# Patient Record
Sex: Male | Born: 1965 | Race: Black or African American | Hispanic: No | Marital: Single | Smoking: Never smoker
Health system: Southern US, Community
[De-identification: ages and names within clinical notes are randomized; demographics above are authoritative.]

## PROBLEM LIST (undated history)

## (undated) ENCOUNTER — Emergency Department (HOSPITAL_COMMUNITY): Admission: EM | Payer: BLUE CROSS/BLUE SHIELD | Source: Home / Self Care

## (undated) DIAGNOSIS — E785 Hyperlipidemia, unspecified: Secondary | ICD-10-CM

## (undated) DIAGNOSIS — D179 Benign lipomatous neoplasm, unspecified: Secondary | ICD-10-CM

## (undated) DIAGNOSIS — M25512 Pain in left shoulder: Secondary | ICD-10-CM

## (undated) DIAGNOSIS — R5383 Other fatigue: Secondary | ICD-10-CM

## (undated) DIAGNOSIS — M109 Gout, unspecified: Secondary | ICD-10-CM

## (undated) DIAGNOSIS — M545 Low back pain, unspecified: Secondary | ICD-10-CM

## (undated) DIAGNOSIS — K469 Unspecified abdominal hernia without obstruction or gangrene: Secondary | ICD-10-CM

## (undated) DIAGNOSIS — I10 Essential (primary) hypertension: Secondary | ICD-10-CM

## (undated) DIAGNOSIS — R350 Frequency of micturition: Secondary | ICD-10-CM

## (undated) HISTORY — DX: Other fatigue: R53.83

## (undated) HISTORY — DX: Pain in left shoulder: M25.512

## (undated) HISTORY — DX: Benign lipomatous neoplasm, unspecified: D17.9

## (undated) HISTORY — DX: Hyperlipidemia, unspecified: E78.5

## (undated) HISTORY — DX: Essential (primary) hypertension: I10

## (undated) HISTORY — DX: Unspecified abdominal hernia without obstruction or gangrene: K46.9

## (undated) HISTORY — PX: REDUCTION OF TORSION OF TESTIS: SUR1096

---

## 1996-12-22 HISTORY — PX: HERNIA REPAIR: SHX51

## 2010-02-24 ENCOUNTER — Emergency Department (HOSPITAL_COMMUNITY): Admission: EM | Admit: 2010-02-24 | Discharge: 2010-02-24 | Payer: Self-pay | Admitting: Emergency Medicine

## 2010-05-22 ENCOUNTER — Encounter: Admission: RE | Admit: 2010-05-22 | Discharge: 2010-05-22 | Payer: Self-pay | Admitting: *Deleted

## 2010-11-04 ENCOUNTER — Encounter: Admission: RE | Admit: 2010-11-04 | Discharge: 2010-11-04 | Payer: Self-pay | Admitting: General Surgery

## 2011-01-11 ENCOUNTER — Encounter: Payer: Self-pay | Admitting: General Surgery

## 2011-10-09 ENCOUNTER — Encounter (INDEPENDENT_AMBULATORY_CARE_PROVIDER_SITE_OTHER): Payer: Self-pay | Admitting: General Surgery

## 2011-10-10 ENCOUNTER — Encounter (INDEPENDENT_AMBULATORY_CARE_PROVIDER_SITE_OTHER): Payer: Self-pay | Admitting: General Surgery

## 2011-10-10 ENCOUNTER — Ambulatory Visit (INDEPENDENT_AMBULATORY_CARE_PROVIDER_SITE_OTHER): Payer: BC Managed Care – PPO | Admitting: General Surgery

## 2011-10-10 VITALS — BP 132/86 | HR 84 | Temp 97.2°F | Resp 20 | Ht 71.0 in | Wt 204.4 lb

## 2011-10-10 DIAGNOSIS — D1720 Benign lipomatous neoplasm of skin and subcutaneous tissue of unspecified limb: Secondary | ICD-10-CM

## 2011-10-10 DIAGNOSIS — D1739 Benign lipomatous neoplasm of skin and subcutaneous tissue of other sites: Secondary | ICD-10-CM

## 2011-10-10 NOTE — Patient Instructions (Signed)
An adult who can drive will need to come with you to drive U. home after surgery and the responsible for use at night he will not be able to drive until the following day. Hopefully you will be out of work only a few days but that will be determined at the time of surgery

## 2011-10-10 NOTE — Progress Notes (Signed)
Subjective:     Patient ID: Kenneth Logan, male   DOB: 08-16-1966, 44 y.o.   MRN: [id number]  HPIList Kenneth Logan is a 45 year old male history of mild hypertension who I saw approximately 10 months ago when he was referred to Dr. Nikki Dom [facility] orthopedics for lipoma in the left supraclavicular area. 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 Patient is continued working of the lipoma does not appear to have increased in size and it still measures probably 3 cm it is located in the supraclavicular really count over the shoulder area it is difficult to measure exactly 3 or the patient's decided he like to have the area removed and I think Workmen's Comp. is no longer involved in the patient's management   Review of Systems Current Outpatient Prescriptions  Medication Sig Dispense Refill  . Ibuprofen (ADVIL PO) Take 3,200 mg by mouth daily.        . simvastatin (ZOCOR) 20 MG tablet       . valsartan-hydrochlorothiazide (DIOVAN-HCT) 160-12.5 MG per tablet Take 1 tablet by mouth daily.         Past Surgical History  Procedure Date  . Hernia repair 1998    left inguinal hernia    Patient states he takes his blood pressure medicines and he thinks he's had a cardiac evaluation but he can't give me the doctor's name and looking at each sharp don't find any thigh and then he thought maybe it was agreed for cardiology but they say he's not a pleasure he's got check for other significant inflammation he's had a CT of the neck there was originally scheduled for CT of the chest that was canceled when I found a lipoma and he also thinks he's had an EKG during that time but he can give me details he works involve use in his upper extremity is the activity is not really hard heavy the a lot of repetitive problems and hopefully removal of lipoma minimize the discomfort that he was having    Objective:   Physical ExamBP 132/86  Pulse 84  Temp 97.2 F (36.2 C)  Resp 20  Ht 5\' 11"  (1.803 m)  Wt 204 lb 6 oz (92.704 kg)  BMI 28.50 kg/m2 Patient's a male appears his stated age and in no acute distress he says he is tired he works third shift last night and he was counseled on first went to the examining room on reexamination he still got a slight prominence and the left supraclavicular area and all measurement best I can tell is probably about 2-1/2 inches in size it may be a little larger and it may actually port the subfascial. Thank you be best to remove this which seizure they can do him on the anterior LMA tube and hopefully not require an endotracheal tube. He says he's had a previous lockout, removed the base of the neck that was done local anesthesia and he may have a little recurrence of not sure who remove that lipoma. His cardiac normal sinus rhythm his chest clear to P&A abdomen no acute tenderness or megaly. No axillary lymphadenopathy or supraclavicular lymphadenopathy that I could appreciate    Assessment:     Lipoma left supraclavicular area that we will plan on removing  [facility]. Patient will try to get copies of his chest and cardiac reports so that it will not need to be repeated  otherwise we will chest pain x-ray and EKG. We'll do a CBC in theB met at the time of his surgery preop    Plan:     To schedulers

## 2011-11-18 ENCOUNTER — Encounter (HOSPITAL_BASED_OUTPATIENT_CLINIC_OR_DEPARTMENT_OTHER): Payer: Self-pay | Admitting: *Deleted

## 2011-11-18 NOTE — Progress Notes (Signed)
NPO AFTER MN. PT TO ARRIVE 0930. NEEDS CBC W/ DIFF, BMET, CXR. PER PT EKG DONE JAN 2012, HE IS TO BRING DOS (IF NOT CURRENT, REPEAT). WILL TAKE ZOCOR AM OF SURG. W/ SIP OF WATER.

## 2011-11-19 ENCOUNTER — Encounter (HOSPITAL_BASED_OUTPATIENT_CLINIC_OR_DEPARTMENT_OTHER): Payer: Self-pay | Admitting: *Deleted

## 2011-11-20 ENCOUNTER — Ambulatory Visit (HOSPITAL_BASED_OUTPATIENT_CLINIC_OR_DEPARTMENT_OTHER): Payer: BC Managed Care – PPO | Admitting: Anesthesiology

## 2011-11-20 ENCOUNTER — Ambulatory Visit (HOSPITAL_BASED_OUTPATIENT_CLINIC_OR_DEPARTMENT_OTHER)
Admission: RE | Admit: 2011-11-20 | Discharge: 2011-11-20 | Disposition: A | Discharge status: Home / Self Care | Payer: BC Managed Care – PPO | Source: Ambulatory Visit | Attending: General Surgery | Admitting: General Surgery

## 2011-11-20 ENCOUNTER — Encounter (HOSPITAL_BASED_OUTPATIENT_CLINIC_OR_DEPARTMENT_OTHER): Payer: Self-pay | Admitting: Anesthesiology

## 2011-11-20 ENCOUNTER — Encounter (HOSPITAL_BASED_OUTPATIENT_CLINIC_OR_DEPARTMENT_OTHER)
Admission: RE | Disposition: A | Discharge status: Home / Self Care | Payer: Self-pay | Source: Ambulatory Visit | Attending: General Surgery

## 2011-11-20 ENCOUNTER — Encounter (HOSPITAL_BASED_OUTPATIENT_CLINIC_OR_DEPARTMENT_OTHER): Payer: Self-pay | Admitting: *Deleted

## 2011-11-20 ENCOUNTER — Ambulatory Visit (HOSPITAL_COMMUNITY): Payer: BC Managed Care – PPO

## 2011-11-20 DIAGNOSIS — D1779 Benign lipomatous neoplasm of other sites: Secondary | ICD-10-CM | POA: Insufficient documentation

## 2011-11-20 DIAGNOSIS — I10 Essential (primary) hypertension: Secondary | ICD-10-CM | POA: Insufficient documentation

## 2011-11-20 DIAGNOSIS — D1739 Benign lipomatous neoplasm of skin and subcutaneous tissue of other sites: Secondary | ICD-10-CM

## 2011-11-20 DIAGNOSIS — Z01818 Encounter for other preprocedural examination: Secondary | ICD-10-CM | POA: Insufficient documentation

## 2011-11-20 DIAGNOSIS — Z79899 Other long term (current) drug therapy: Secondary | ICD-10-CM | POA: Insufficient documentation

## 2011-11-20 DIAGNOSIS — Z01812 Encounter for preprocedural laboratory examination: Secondary | ICD-10-CM | POA: Insufficient documentation

## 2011-11-20 HISTORY — DX: Low back pain, unspecified: M54.50

## 2011-11-20 HISTORY — DX: Frequency of micturition: R35.0

## 2011-11-20 HISTORY — DX: Low back pain: M54.5

## 2011-11-20 HISTORY — PX: LIPOMA EXCISION: SHX5283

## 2011-11-20 LAB — BASIC METABOLIC PANEL
BUN: 16 mg/dL (ref 6–23)
Creatinine, Ser: 1.26 mg/dL (ref 0.50–1.35)
GFR calc Af Amer: 77 mL/min — ABNORMAL LOW (ref >90)
GFR calc non Af Amer: 66 mL/min — ABNORMAL LOW (ref >90)
Glucose, Bld: 97 mg/dL (ref 70–99)

## 2011-11-20 LAB — CBC
HCT: 39.3 % (ref 39.0–52.0)
Hemoglobin: 13.2 g/dL (ref 13.0–17.0)
MCH: 31.4 pg (ref 26.0–34.0)
MCHC: 33.6 g/dL (ref 30.0–36.0)
MCV: 93.3 fL (ref 78.0–100.0)

## 2011-11-20 LAB — DIFFERENTIAL
Basophils Relative: 1 % (ref 0–1)
Eosinophils Absolute: 0.1 10*3/uL (ref 0.0–0.7)
Eosinophils Relative: 2 % (ref 0–5)
Lymphs Abs: 1.8 10*3/uL (ref 0.7–4.0)
Monocytes Absolute: 0.5 10*3/uL (ref 0.1–1.0)
Monocytes Relative: 10 % (ref 3–12)

## 2011-11-20 SURGERY — EXCISION LIPOMA
Anesthesia: General | Site: Shoulder | Laterality: Left

## 2011-11-20 MED ORDER — ONDANSETRON HCL 4 MG/2ML IJ SOLN
INTRAMUSCULAR | Status: DC | PRN
  Administered 2011-11-20: 4 mg via INTRAVENOUS

## 2011-11-20 MED ORDER — FENTANYL CITRATE 0.05 MG/ML IJ SOLN
25.0000 ug | INTRAMUSCULAR | Status: DC | PRN
  Administered 2011-11-20 (×3): 25 ug via INTRAVENOUS

## 2011-11-20 MED ORDER — CEFAZOLIN SODIUM-DEXTROSE 2-3 GM-% IV SOLR
2.0000 g | Freq: Once | INTRAVENOUS | Status: AC
  Administered 2011-11-20: 2 g via INTRAVENOUS

## 2011-11-20 MED ORDER — MIDAZOLAM HCL 5 MG/5ML IJ SOLN
INTRAMUSCULAR | Status: DC | PRN
  Administered 2011-11-20: 2 mg via INTRAVENOUS

## 2011-11-20 MED ORDER — FENTANYL CITRATE 0.05 MG/ML IJ SOLN
INTRAMUSCULAR | Status: DC | PRN
  Administered 2011-11-20 (×5): 50 ug via INTRAVENOUS
  Administered 2011-11-20: 25 ug via INTRAVENOUS
  Administered 2011-11-20 (×2): 50 ug via INTRAVENOUS
  Administered 2011-11-20: 25 ug via INTRAVENOUS

## 2011-11-20 MED ORDER — DEXAMETHASONE SODIUM PHOSPHATE 4 MG/ML IJ SOLN
INTRAMUSCULAR | Status: DC | PRN
  Administered 2011-11-20: 10 mg via INTRAVENOUS

## 2011-11-20 MED ORDER — LACTATED RINGERS IV SOLN: INTRAVENOUS | Status: DC

## 2011-11-20 MED ORDER — LACTATED RINGERS IV SOLN
INTRAVENOUS | Status: DC
  Administered 2011-11-20 (×2): via INTRAVENOUS

## 2011-11-20 MED ORDER — PROMETHAZINE HCL 25 MG/ML IJ SOLN: 6.2500 mg | INTRAMUSCULAR | Status: DC | PRN

## 2011-11-20 MED ORDER — PROPOFOL 10 MG/ML IV EMUL
INTRAVENOUS | Status: DC | PRN
  Administered 2011-11-20: 200 mg via INTRAVENOUS

## 2011-11-20 MED ORDER — LIDOCAINE-EPINEPHRINE (PF) 1 %-1:200000 IJ SOLN
INTRAMUSCULAR | Status: DC | PRN
  Administered 2011-11-20: 14:00:00 via INTRAMUSCULAR

## 2011-11-20 MED ORDER — LIDOCAINE HCL (CARDIAC) 20 MG/ML IV SOLN
INTRAVENOUS | Status: DC | PRN
  Administered 2011-11-20: 100 mg via INTRAVENOUS

## 2011-11-20 SURGICAL SUPPLY — 60 items
BANDAGE ADHESIVE 1X3 (GAUZE/BANDAGES/DRESSINGS) IMPLANT
BANDAGE GAUZE ELAST BULKY 4 IN (GAUZE/BANDAGES/DRESSINGS) IMPLANT
BENZOIN TINCTURE PRP APPL 2/3 (GAUZE/BANDAGES/DRESSINGS) IMPLANT
BLADE HEX COATED 2.75 (ELECTRODE) ×2 IMPLANT
BLADE SURG 15 STRL LF DISP TIS (BLADE) ×1 IMPLANT
BLADE SURG 15 STRL SS (BLADE) ×1
BLADE SURG ROTATE 9660 (MISCELLANEOUS) IMPLANT
CANISTER SUCTION 1200CC (MISCELLANEOUS) IMPLANT
CANISTER SUCTION 2500CC (MISCELLANEOUS) ×2 IMPLANT
CLEANER CAUTERY TIP 5X5 PAD (MISCELLANEOUS) ×1 IMPLANT
CLOTH BEACON ORANGE TIMEOUT ST (SAFETY) ×2 IMPLANT
COVER MAYO STAND STRL (DRAPES) ×2 IMPLANT
COVER TABLE BACK 60X90 (DRAPES) ×2 IMPLANT
DRAIN HEMOVAC 1/8 X 5 (WOUND CARE) IMPLANT
DRAIN PENROSE 18X1/2 LTX STRL (DRAIN) IMPLANT
DRAPE LG THREE QUARTER DISP (DRAPES) ×2 IMPLANT
DRAPE ORTHO SPLIT 77X108 STRL (DRAPES)
DRAPE PED LAPAROTOMY (DRAPES) ×2 IMPLANT
DRAPE SURG ORHT 6 SPLT 77X108 (DRAPES) IMPLANT
DRSG EMULSION OIL 3X3 NADH (GAUZE/BANDAGES/DRESSINGS) IMPLANT
DRSG TEGADERM 4X4.75 (GAUZE/BANDAGES/DRESSINGS) ×2 IMPLANT
ELECT REM PT RETURN 9FT ADLT (ELECTROSURGICAL) ×2
ELECTRODE REM PT RTRN 9FT ADLT (ELECTROSURGICAL) ×1 IMPLANT
EVACUATOR SILICONE 100CC (DRAIN) IMPLANT
GAUZE SPONGE 4X4 12PLY STRL LF (GAUZE/BANDAGES/DRESSINGS) IMPLANT
GAUZE VASELINE 3X9 (GAUZE/BANDAGES/DRESSINGS) IMPLANT
GLOVE BIO SURGEON STRL SZ7.5 (GLOVE) ×2 IMPLANT
GLOVE BIOGEL M 7.0 STRL (GLOVE) ×2 IMPLANT
GLOVE BIOGEL PI IND STRL 6.5 (GLOVE) ×2 IMPLANT
GLOVE BIOGEL PI INDICATOR 6.5 (GLOVE) ×2
GOWN BRE IMP SLV AUR XL STRL (GOWN DISPOSABLE) ×2 IMPLANT
GOWN PREVENTION PLUS LG XLONG (DISPOSABLE) ×4 IMPLANT
NEEDLE HYPO 25X1 1.5 SAFETY (NEEDLE) IMPLANT
NS IRRIG 500ML POUR BTL (IV SOLUTION) ×2 IMPLANT
PACK BASIN DAY SURGERY FS (CUSTOM PROCEDURE TRAY) ×2 IMPLANT
PAD CLEANER CAUTERY TIP 5X5 (MISCELLANEOUS) ×1
PENCIL BUTTON HOLSTER BLD 10FT (ELECTRODE) ×2 IMPLANT
SPONGE GAUZE 2X2 8PLY STRL LF (GAUZE/BANDAGES/DRESSINGS) IMPLANT
SPONGE GAUZE 4X4 12PLY (GAUZE/BANDAGES/DRESSINGS) ×2 IMPLANT
SPONGE INTESTINAL PEANUT (DISPOSABLE) ×4 IMPLANT
STRIP CLOSURE SKIN 1/2X4 (GAUZE/BANDAGES/DRESSINGS) ×2 IMPLANT
SUCTION FRAZIER TIP 10 FR DISP (SUCTIONS) IMPLANT
SUT CHROMIC 3 0 SH 27 (SUTURE) IMPLANT
SUT ETHILON 3 0 PS 1 (SUTURE) ×4 IMPLANT
SUT ETHILON 4 0 PS 2 18 (SUTURE) IMPLANT
SUT ETHILON 5 0 PS 2 18 (SUTURE) IMPLANT
SUT MNCRL AB 4-0 PS2 18 (SUTURE) ×2 IMPLANT
SUT MON AB 5-0 PS2 18 (SUTURE) IMPLANT
SUT VIC AB 3-0 54X BRD REEL (SUTURE) ×1 IMPLANT
SUT VIC AB 3-0 BRD 54 (SUTURE) ×1
SUT VIC AB 4-0 SH 18 (SUTURE) ×2 IMPLANT
SUT VICRYL 4-0 PS2 18IN ABS (SUTURE) ×4 IMPLANT
SYR BULB 3OZ (MISCELLANEOUS) ×2 IMPLANT
SYR CONTROL 10ML LL (SYRINGE) IMPLANT
TAPE STRIPS DRAPE STRL (GAUZE/BANDAGES/DRESSINGS) ×2 IMPLANT
TOWEL OR 17X24 6PK STRL BLUE (TOWEL DISPOSABLE) ×2 IMPLANT
TRAY DSU PREP LF (CUSTOM PROCEDURE TRAY) ×2 IMPLANT
TUBE CONNECTING 12X1/4 (SUCTIONS) ×2 IMPLANT
WATER STERILE IRR 500ML POUR (IV SOLUTION) ×2 IMPLANT
YANKAUER SUCT BULB TIP NO VENT (SUCTIONS) ×2 IMPLANT

## 2011-11-20 NOTE — Anesthesia Postprocedure Evaluation (Signed)
  Anesthesia Post-op Note  Patient: Kenneth Logan  Procedure(s) Performed:  EXCISION LIPOMA  Patient Location: PACU  Anesthesia Type: General  Level of Consciousness: awake and alert   Airway and Oxygen Therapy: Patient Spontanous Breathing  Post-op Pain: mild  Post-op Assessment: Post-op Vital signs reviewed, Patient's Cardiovascular Status Stable, Respiratory Function Stable, Patent Airway and No signs of Nausea or vomiting  Post-op Vital Signs: stable  Complications: No apparent anesthesia complications

## 2011-11-20 NOTE — Brief Op Note (Signed)
11/20/2011  2:58 PM  PATIENT:  Kenneth Logan  45 y.o. male  PRE-OPERATIVE DIAGNOSIS:  LIPOMA LEFT supraclavicular area  POST-OPERATIVE DIAGNOSIS:  LIPOMA LEFT supraclavicular submuscular greater 4cm  PROCEDURE:  Procedure(s): EXCISION LIPOMA left supraclavicular >4cm  SURGEON:  Surgeon(s): Iona Coach, MD  PHYSICIAN ASSISTANT:   ASSISTANTS: none   ANESTHESIA:   general  EBL:  Total I/O In: 1900 [I.V.:1900] Out: 50 [Blood:50]  BLOOD ADMINISTERED:none  DRAINS: none   LOCAL MEDICATIONS USED:  MARCAINE 10CC  SPECIMEN:  Excision  DISPOSITION OF SPECIMEN:  PATHOLOGY  COUNTS:  YES  TOURNIQUET:  * No tourniquets in log *  DICTATION: .Other Dictation: Dictation Number [id number]  PLAN OF CARE: Discharge to home after PACU  PATIENT DISPOSITION:  PACU - hemodynamically stable.   Delay start of Pharmacological VTE agent (>24hrs) due to surgical blood loss or risk of bleeding:  {YES/NO/NOT APPLICABLE:20182

## 2011-11-20 NOTE — Anesthesia Procedure Notes (Signed)
Procedure Name: LMA Insertion Date/Time: 11/20/2011 12:23 PM Performed by: Huel Coventry Pre-anesthesia Checklist: Patient identified, Emergency Drugs available, Suction available and Patient being monitored Patient Re-evaluated:Patient Re-evaluated prior to inductionOxygen Delivery Method: Circle System Utilized Preoxygenation: Pre-oxygenation with 100% oxygen Intubation Type: IV induction Ventilation: Mask ventilation without difficulty LMA: LMA inserted LMA Size: 4.0 Number of attempts: 1 Airway Equipment and Method: bite block Placement Confirmation: positive ETCO2 Tube secured with: Tape Dental Injury: Teeth and Oropharynx as per pre-operative assessment

## 2011-11-20 NOTE — H&P (Signed)
Kenneth Logan  Description:  45 year old male  10/10/2011 11:00 AM Office Visit Provider:  Iona Coach, MD  MRN: [id number] Department:  [facility]            Diagnoses  Reason for Visit    Lipoma of shoulder - Primary  Other   214.1  new pt eval of left shoulder pain            Vitals - Last Recorded       BP  Pulse  Temp  Resp  Ht  Wt    132/86  84  97.2 F (36.2 C)  20  5\' 11"  (1.803 m)  204 lb 6 oz (92.704 kg)           BMI               28.50 kg/m2                   Progress Notes     Iona Coach, MD 10/10/2011 12:26 PM Signed    Subjective:    Patient ID: Kenneth Logan, male DOB: 11-Nov-1966, 45 y.o. MRN: [id number]  HPIList Samin Milke is a 45 year old male history of mild hypertension who I saw approximately 10 months ago when he was referred to Dr. Nikki Dom mg orthopedics for lipoma in the left supraclavicular area. 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 Patient is continued working of the lipoma does not appear to have increased in size and it still measures probably 3 cm it is located in the supraclavicular really count over the shoulder area it is difficult to measure exactly 3 or the patient's decided he like to have the area removed and I think Workmen's Comp. is no longer involved in the patient's management  Review of Systems     Current Outpatient Prescriptions     Medication  Sig  Dispense  Refill     .  Ibuprofen (ADVIL PO)  Take 3,200 mg by mouth daily.       .  simvastatin (ZOCOR) 20 MG tablet        .  valsartan-hydrochlorothiazide (DIOVAN-HCT) 160-12.5 MG per tablet  Take 1 tablet by mouth daily.           Past Surgical History     Procedure  Date     .  Hernia repair  1998       left inguinal hernia      Patient states he takes his blood pressure medicines and he thinks he's had a  cardiac evaluation but he can't give me the doctor's name and looking at each sharp don't find any thigh and then he thought maybe it was agreed for cardiology but they say he's not a pleasure he's got check for other significant inflammation he's had a CT of the neck there was originally scheduled for CT of the chest that was canceled when I found a lipoma and he also thinks he's had an EKG during that time but he can give me details he works involve use in his upper extremity is the activity is not really hard heavy the a lot of repetitive problems and hopefully removal of lipoma minimize the discomfort that he was having       Objective:      Physical ExamBP 132/86  Pulse 84  Temp 97.2 F (36.2 C)  Resp 20  Ht 5\' 11"  (1.803 m)  Wt 204 lb 6 oz (92.704 kg)  BMI 28.50 kg/m2  Patient's a male appears his stated age and in no acute distress he says he is tired he works third shift last night and he was counseled on first went to the examining room on reexamination he still got a slight prominence and the left supraclavicular area and all measurement best I can tell is probably about 2-1/2 inches in size it may be a little larger and it may actually port the subfascial. Thank you be best to remove this which seizure they can do him on the anterior LMA tube and hopefully not require an endotracheal tube. He says he's had a previous lockout, removed the base of the neck that was done local anesthesia and he may have a little recurrence of not sure who remove that lipoma. His cardiac normal sinus rhythm his chest clear to P&A abdomen no acute tenderness or megaly. No axillary lymphadenopathy or supraclavicular lymphadenopathy that I could appreciate       Assessment:       Lipoma left supraclavicular area that we will plan on removing [facility]. Patient will try to get copies of his chest and cardiac reports so that it will not need to be repeated otherwise we will chest pain x-ray and EKG. We'll do a CBC  in theB met at the time of his surgery preop       Plan:       To schedulers                Not recorded                            Patient Instructions     An adult who can drive will need to come with you to drive [facility]. home after surgery and the responsible for use at night he will not be able to drive until the following day. Hopefully you will be out of work only a few days but that will be determined at the time of surgery          Level of Service     PR OFFICE/OUTPT VISIT,EST,LEVL III [78295]           All Flowsheet Templates (all recorded)     Encounter Vitals Flowsheet   Custom Formula Data Flowsheet   Anthropometrics Flowsheet                           Referring Provider          Irving Copas            All Charges for This Encounter       Code  Description  Service Date  Service Provider  Modifiers  Quantity    99213  PR OFFICE/OUTPT VISIT,EST,LEVL III  10/10/2011  Iona Coach, MD   1                Other Encounter Related Information     Allergies & Medications      Problem List      History      Patient-Entered Questionnaires       No data filed

## 2011-11-20 NOTE — Transfer of Care (Signed)
Immediate Anesthesia Transfer of Care Note  Patient: Kenneth Logan  Procedure(s) Performed:  EXCISION LIPOMA  Patient Location: PACU  Anesthesia Type: General  Level of Consciousness:pt. Sleepy, arouses to name and follows commands.  Airway & Oxygen Therapy: Patient Spontanous Breathing and Patient connected to face mask oxygen  Post-op Assessment: Report given to PACU RN and Post -op Vital signs reviewed and stable  Post vital signs: Reviewed and stable  Complications: No apparent anesthesia complications

## 2011-11-20 NOTE — Anesthesia Preprocedure Evaluation (Addendum)
Anesthesia Evaluation  Patient identified by MRN, date of birth, ID band Patient awake    Reviewed: Allergy & Precautions, H&P , NPO status , Patient's Chart, lab work & pertinent test results  Airway Mallampati: II TM Distance: >3 FB Neck ROM: full    Dental No notable dental hx. (+) Teeth Intact and Dental Advisory Given   Pulmonary neg pulmonary ROS,  clear to auscultation  Pulmonary exam normal       Cardiovascular Exercise Tolerance: Good hypertension, On Medications neg cardio ROS regular Normal    Neuro/Psych Negative Neurological ROS  Negative Psych ROS   GI/Hepatic negative GI ROS, Neg liver ROS,   Endo/Other  Negative Endocrine ROS  Renal/GU negative Renal ROS  Genitourinary negative   Musculoskeletal   Abdominal   Peds  Hematology negative hematology ROS (+)   Anesthesia Other Findings   Reproductive/Obstetrics negative OB ROS                          Anesthesia Physical Anesthesia Plan  ASA: II  Anesthesia Plan: General   Post-op Pain Management:    Induction: Intravenous  Airway Management Planned: LMA  Additional Equipment:   Intra-op Plan:   Post-operative Plan:   Informed Consent: I have reviewed the patients History and Physical, chart, labs and discussed the procedure including the risks, benefits and alternatives for the proposed anesthesia with the patient or authorized representative who has indicated his/her understanding and acceptance.   Dental Advisory Given  Plan Discussed with: CRNA  Anesthesia Plan Comments:        Anesthesia Quick Evaluation

## 2011-11-20 NOTE — Interval H&P Note (Signed)
History and Physical Interval Note:  11/20/2011 12:01 PM  Kenneth Logan  has presented today for surgery, with the diagnosis of LIPOMA LEFT SHOULDER  The various methods of treatment have been discussed with the patient and family. After consideration of risks, benefits and other options for treatment, the patient has consented to  Procedure(s): EXCISION LIPOMA as a surgical intervention .  The patients' history has been reviewed, patient examined, no change in status, stable for surgery.  I have reviewed the patients' chart and labs.  Questions were answered to the patient's satisfaction.     [name] Pt was reexamined and lipoma in left lat supraclavicular area marked. Permit signed. Lungs and heart exam neg. All questions answered. Back to work [employer]

## 2011-11-21 ENCOUNTER — Other Ambulatory Visit (INDEPENDENT_AMBULATORY_CARE_PROVIDER_SITE_OTHER): Payer: Self-pay | Admitting: General Surgery

## 2011-11-21 ENCOUNTER — Encounter (HOSPITAL_BASED_OUTPATIENT_CLINIC_OR_DEPARTMENT_OTHER): Payer: Self-pay | Admitting: General Surgery

## 2011-11-21 NOTE — Op Note (Signed)
NAME:  [name], [name]                  ACCOUNT NO.:  MEDICAL RECORD NO.:  [id number]  LOCATION:                                 FACILITY:  PHYSICIAN:  Anselm Pancoast. Zachery Dakins, M.D.  DATE OF BIRTH:  DATE OF PROCEDURE:  11/20/2011 DATE OF DISCHARGE:                              OPERATIVE REPORT   PREOPERATIVE DIAGNOSIS:  Lipoma, left supraclavicular area.  OPERATION:  Removal of lipoma, left supraclavicular area, general anesthesia, prone position.  SURGEON:  Anselm Pancoast. Zachery Dakins, M.D.  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  He was seen several months ago and was diagnosed having muscle spasm.  This was a [employer], but they did a CT and it showed a lipoma in the left supraclavicular area and the workman's case was terminated and thought this was not a workman's comp issue.  On the CT, this appears to be a 3 to 4 cm lipoma.  In the supraclavicular area, it is not real superficial and when I saw him back in October, I was kind of vague on whether recommended that the area be removed, his pain seemed to be quite vague and there was not really a whole lot that you could definitely feel, but you could definitely feel that there is a lipoma like mass what appears to probably be under the superficial muscles in the supraclavicular area.  The patient did not schedule, but came back approximately a month later and want to go ahead and proceed with having the area removed and he was scheduled for excision at this time.  I did not do any repeat x-rays and on physical examination, you could feel the area.  When he strains, it is more prominent.  When he got his arms elevated, etc., you do not feel anything, but this is certainly different from the right supraclavicular area.  The patient does fairly  strenuous activity and he is quite muscular.  Preoperatively, the history and physical was updated.  Permit was signed and he understands that he hopefully will be able to return to work next week and we hope that removal of this will relieve the discomfort that he has been describing.  The patient's area was marked.  The area was about the size of maybe a small hen's egg, but it is not something that it is real obviously, easily felt.  The patient positioned on the OR table, LMA tube placed and then the head turned to the right side and then we prepped this area, which is really the neck, anterior chest, top part of the shoulder etc, and then draped in a sterile manner.  I made a little incision that was really so like if you were doing a low cervical incision, but the area much more lateral and sharp dissection done through the skin and subcutaneous tissue and then in this area if you go really just anterior to the trapezius muscle and get through the little muscle, its over this subclavicular area, then  you could actually feel the little lipoma.  The area as far as there were several little blood vessels going through this area, I could not see any obvious nerve structures but I made a little opening down to the actual lipoma and then tried to tease the area from the surrounding normal subcutaneous tissue that was fatty tissue that was on the undersurface of the trapezius and then coursed down kind of on the clavicle.  I was very cautious and dissecting down and kind of freeing up a little fascia circumferentially and then you could get a finger down in the area and then it was noted that this is something about the size that I confirmed and then down deep under the area you can actually feel the muscles and possibly the nerves going to the left shoulder and I expect it would give him pain with the nature of the work that he is doing.  I first was using [employer] and [employer] and then  [employer] and then kind of Goelet, kind of stretch in the muscular, did not want to cut the actual trapezius and then after I had it freed up circumferentially, then you could kind of pull up on the area and the little fascia down deep which I could visualize after I could get kind under the area.  When this was freed, then the area in toto could be removed.  There were several little bleeders that were clamped and ligated with 4-0 Vicryl, and there was a little excess fatty tissue right under the trapezius that I removed, but that was probably normal fatty tissue and not the actual definite lipoma.  The bleeders were controlled with a little 4-0 Vicryl ties of sutures and then after the wound was irrigated with good hemostasis, I put about 10 mL of Marcaine with adrenaline in the subcutaneous tissue and kind of the trapezius muscle, and then closed the muscular layer with interrupted sutures of 4-0 Vicryl, 4-0 Monocryl subcuticular, and benzoin and half inch Steri-Strips on the skin.  The procedure was tolerated nicely.  Hopefully, he will not have problems with bleeding and Vicodin for pain medication.  Whether he will be able to return to work on Monday or he will be off a week, will be determined by the nature of his pain since he had noted that working aggravated this problem.  The specimen was sent for permanent path exam.  I am sure it is going to be a benign lipoma and I think that I have excised it in total.     Anselm Pancoast. Zachery Dakins, M.D.     WJW/MEDQ  D:  11/20/2011  T:  11/20/2011  Job:  [id number]

## 2011-11-28 ENCOUNTER — Encounter (INDEPENDENT_AMBULATORY_CARE_PROVIDER_SITE_OTHER): Payer: Self-pay | Admitting: General Surgery

## 2011-11-28 ENCOUNTER — Ambulatory Visit (INDEPENDENT_AMBULATORY_CARE_PROVIDER_SITE_OTHER): Payer: BC Managed Care – PPO | Admitting: General Surgery

## 2011-11-28 VITALS — BP 172/112 | HR 60 | Temp 96.9°F | Resp 16 | Ht 71.5 in | Wt 210.0 lb

## 2011-11-28 DIAGNOSIS — D1739 Benign lipomatous neoplasm of skin and subcutaneous tissue of other sites: Secondary | ICD-10-CM

## 2011-11-28 NOTE — Progress Notes (Signed)
Patient ID: [name], male   DOB: [birth year], 45 y.o.   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 addendum I found a lipoma or an MRI or CT of the chest showed that a discontinued a Workmen's Comp. and he returned to see me and I excised the area. This was a large lipoma located under the trapezius pushed into a, supraclavicular area and was able to remove the mass in toto to him he says that his discomfort in his shoulder is definitely improving and he has returned to work. There is no evidence of any surrounding all or problems at this time he said he had a little numbness of the distal to the little incision work through his strength of his arm and spine and I will see him again in approximately 2 weeks for final postop visit. He also has a lipoma in the base of the neck there's been previously excised and they want to be reexcised at a later date but I would let him get directly over this extensive surgery before considering removal of that problem. BP 172/112  Pulse 60  Temp(Src) 96.9 F (36.1 C) (Temporal)  Resp 16  Ht 5' 11.5" (1.816 m)  Wt 210 lb (95.255 kg)  BMI 28.88 kg/m2 Good breath sounds and good strength in his left arm and the little numbness that he experienced right after surgery and the more proximal superior shoulder area is resolving

## 2011-12-11 ENCOUNTER — Encounter (INDEPENDENT_AMBULATORY_CARE_PROVIDER_SITE_OTHER): Payer: BC Managed Care – PPO | Admitting: General Surgery

## 2011-12-12 ENCOUNTER — Encounter (INDEPENDENT_AMBULATORY_CARE_PROVIDER_SITE_OTHER): Payer: BC Managed Care – PPO | Admitting: General Surgery

## 2012-05-08 ENCOUNTER — Emergency Department (HOSPITAL_COMMUNITY)
Admission: EM | Admit: 2012-05-08 | Discharge: 2012-05-08 | Disposition: A | Discharge status: Home / Self Care | Payer: BC Managed Care – PPO | Attending: Emergency Medicine | Admitting: Emergency Medicine

## 2012-05-08 ENCOUNTER — Encounter (HOSPITAL_COMMUNITY): Payer: Self-pay | Admitting: Emergency Medicine

## 2012-05-08 DIAGNOSIS — E785 Hyperlipidemia, unspecified: Secondary | ICD-10-CM | POA: Insufficient documentation

## 2012-05-08 DIAGNOSIS — G629 Polyneuropathy, unspecified: Secondary | ICD-10-CM

## 2012-05-08 DIAGNOSIS — G609 Hereditary and idiopathic neuropathy, unspecified: Secondary | ICD-10-CM | POA: Insufficient documentation

## 2012-05-08 DIAGNOSIS — I10 Essential (primary) hypertension: Secondary | ICD-10-CM | POA: Insufficient documentation

## 2012-05-08 NOTE — Discharge Instructions (Signed)
   Your presentation today is most consistent with a neuropathy, or problem with a nerve.  Usually this occurs due to pressure upon the nerve.  Typically this resolves spontaneously with time.  Please be sure to follow-up with your physician via telephone to arrange appropriate ongoing care.

## 2012-05-08 NOTE — ED Provider Notes (Signed)
History    This chart was scribed for Gerhard Munch, MD, MD by Smitty Pluck. The patient was seen in room STRE1 and the patient's care was started at 11:38AM.   CSN: [id number]  Arrival date & time 05/08/12  1126   First MD Initiated Contact with Patient 05/08/12 1134      Chief Complaint  Patient presents with  . Numbness    right thigh    The history is provided by the patient.   Kenneth Logan is a 46 y.o. male who presents to the Emergency Department complaining of moderate right thigh numbness onset 1 day ago. Pt denies radiation of numbness. Denies pain. Pt reports that he was working on his car and working on the tires while having continued pressure on his thigh. Pt reports that he played golf earlier in the day without any symptoms. Symptoms have been constant since onset. Pt has HTN and hyperlipidemia. Denies hx of MS, stroke and MI. Reports that he drinks alcohol (6 beers per week).  No clear alleviating or exacerbating factors.  Past Medical History  Diagnosis Date  . 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 Fatigue   . Hyperlipidemia   . Hernia   . Left shoulder pain     pt having pain and stiffness   . Low back pain     S/P EPI INJECTION  . Urinary frequency   . Hypertension     REQUESTED STRESS TEST JAN 2012  (PER PT HAD DONE)AT [facility] (-- THEY HAVE NO RECORDS PER MED. REC.    Past Surgical History  Procedure Date  . 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 Hernia repair 1998    left inguinal hernia   . Lipoma excision 11/20/2011    Procedure: EXCISION LIPOMA;  Surgeon: Iona Coach, MD;  Location: [facility];  Service: General;  Laterality: Left;    Family History  Problem Relation Age of Onset  . Cancer Father     liver    History  Substance Use Topics  . Smoking status: Never Smoker   . Smokeless tobacco: Never Used  . Alcohol Use: Yes     six beers per week or more       Review of Systems  Constitutional:       Per HPI, otherwise negative  HENT:       Per HPI, otherwise negative  Eyes: Negative.   Respiratory:       Per HPI, otherwise negative  Cardiovascular:       Per HPI, otherwise negative  Gastrointestinal: Negative for vomiting.  Genitourinary: Negative.   Musculoskeletal:       Per HPI, otherwise negative  Skin: Negative.   Neurological: Negative for syncope.    Allergies  Review of patient's allergies indicates no known allergies.  Home Medications   Current Outpatient Rx  Name Route Sig Dispense Refill  . VITAMIN D 1000 UNITS PO TABS Oral Take 1,000 Units by mouth daily.      Marland Kitchen ADVIL PO Oral Take 400 mg by mouth daily.     Marland Kitchen ONE-DAILY MULTI VITAMINS PO TABS Oral Take 1 tablet by mouth daily.      Marland Kitchen SIMVASTATIN 20 MG PO TABS  20 mg every morning.     Marland Kitchen VALSARTAN-HYDROCHLOROTHIAZIDE 160-12.5 MG PO TABS Oral Take 1 tablet by mouth daily.       BP 144/90  Pulse 77  Temp(Src) 98.4 F (36.9 C) (Oral)  Resp 20  SpO2 99%  Physical Exam  Nursing note and vitals reviewed. Constitutional: He is oriented to person, place, and time. He appears well-developed. No distress.  HENT:  Head: Normocephalic and atraumatic.  Eyes: Conjunctivae and EOM are normal.  Cardiovascular: Normal rate and regular rhythm.   Pulmonary/Chest: Effort normal. No stridor. No respiratory distress.  Abdominal: He exhibits no distension.  Musculoskeletal: He exhibits no edema.  Neurological: He is alert and oriented to person, place, and time. He displays no atrophy and no tremor. No cranial nerve deficit or sensory deficit. He exhibits normal muscle tone. He displays no seizure activity. Coordination and gait normal.       Patient as appropriate sensation symmetrically in both thighs / shins / hips. Strength is 5/5 throughout each lower extremity.  Skin: Skin is warm and dry.  Psychiatric: He has a normal mood and affect.    ED Course  Procedures  (including critical care time) DIAGNOSTIC STUDIES:  COORDINATION OF CARE: 11:43AM EDP discusses pt ED treatment and post ED treatment with pt.   Labs Reviewed - No data to display No results found.   No diagnosis found.    MDM  I personally performed the services described in this documentation, which was scribed in my presence. The recorded information has been reviewed and considered.  This generally well male presents with new right thigh dysesthesia.  On my exam the patient is in no distress with no focal neurologic deficits, though he notes a subjective numbness throughout the right anterior and lateral thigh.  The distribution of deficits, and the patient's description of having continuous pressure on the proximal area prior to the onset of symptoms is suggestive of a peripheral neuropathy.  The absence of any other focal neurologic deficits, risk factors for neuro degenerative processes, the denial of incontinence or ataxia his reassuring.  I discussed, at length, with the patient the pathophysiology, natural course of his likely affliction, and stressed the importance of appropriate followup care.  The patient was discharged in stable condition.      Gerhard Munch, MD 05/08/12 [phone]

## 2012-05-08 NOTE — ED Notes (Signed)
Pt reports numbness to right thigh onset 0430. Pt was working on his car at this time. Pt played Golf earlier in the day.

## 2012-06-06 ENCOUNTER — Encounter (HOSPITAL_COMMUNITY): Payer: Self-pay | Admitting: Emergency Medicine

## 2012-06-06 ENCOUNTER — Emergency Department (HOSPITAL_COMMUNITY)
Admission: EM | Admit: 2012-06-06 | Discharge: 2012-06-06 | Disposition: A | Discharge status: Home / Self Care | Payer: BC Managed Care – PPO | Attending: Emergency Medicine | Admitting: Emergency Medicine

## 2012-06-06 DIAGNOSIS — T148 Other injury of unspecified body region: Secondary | ICD-10-CM | POA: Insufficient documentation

## 2012-06-06 DIAGNOSIS — I10 Essential (primary) hypertension: Secondary | ICD-10-CM | POA: Insufficient documentation

## 2012-06-06 DIAGNOSIS — W57XXXA Bitten or stung by nonvenomous insect and other nonvenomous arthropods, initial encounter: Secondary | ICD-10-CM | POA: Insufficient documentation

## 2012-06-06 DIAGNOSIS — E785 Hyperlipidemia, unspecified: Secondary | ICD-10-CM | POA: Insufficient documentation

## 2012-06-06 MED ORDER — DIPHENHYDRAMINE HCL 25 MG PO TABS: 25.0000 mg | ORAL_TABLET | Freq: Four times a day (QID) | ORAL | Status: DC

## 2012-06-06 MED ORDER — HYDROCORTISONE 1 % EX CREA: TOPICAL_CREAM | CUTANEOUS | Status: AC

## 2012-06-06 NOTE — ED Provider Notes (Signed)
History     CSN: [id number]  Arrival date & time 06/06/12  2027   First MD Initiated Contact with Patient 06/06/12 2121      Chief Complaint  Patient presents with  . Insect Bite    (Consider location/radiation/quality/duration/timing/severity/associated sxs/prior treatment) Patient is a 46 y.o. male presenting with rash. The history is provided by the patient.  Rash  This is a new problem. The current episode started 6 to 12 hours ago. The problem has been gradually worsening. The problem is associated with an unknown factor. There has been no fever. The rash is present on the torso, right upper leg, right lower leg, left upper leg, left lower leg, right arm and left arm. The patient is experiencing no pain. Associated symptoms include itching. He has tried anti-itch cream for the symptoms.  PT states stayed at a hotel last night. Woke up with red bumps on back, arms, legs. States very itchy. No new products. States did go to [employer]. No fever, chills, swelling of lips, face  Past Medical History  Diagnosis Date  . 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 Fatigue   . Hyperlipidemia   . Hernia   . Left shoulder pain     pt having pain and stiffness   . Low back pain     S/P EPI INJECTION  . Urinary frequency   . Hypertension     REQUESTED STRESS TEST JAN 2012  (PER PT HAD DONE)AT [facility] (-- THEY HAVE NO RECORDS PER MED. REC.    Past Surgical History  Procedure Date  . 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 Hernia repair 1998    left inguinal hernia   . Lipoma excision 11/20/2011    Procedure: EXCISION LIPOMA;  Surgeon: Iona Coach, MD;  Location: [facility];  Service: General;  Laterality: Left;    Family History  Problem Relation Age of Onset  . Cancer Father     liver    History  Substance Use Topics  . Smoking status: Never Smoker   . Smokeless tobacco: Never Used  . Alcohol Use: Yes     six  beers per week or more      Review of Systems  Constitutional: Negative for fever and chills.  HENT: Negative for facial swelling.   Respiratory: Negative.   Cardiovascular: Negative.   Skin: Positive for itching and rash.    Allergies  Review of patient's allergies indicates no known allergies.  Home Medications   Current Outpatient Rx  Name Route Sig Dispense Refill  . ASPIRIN EC 81 MG PO TBEC Oral Take 81 mg by mouth daily.    Marland Kitchen VITAMIN D 1000 UNITS PO TABS Oral Take 1,000 Units by mouth daily.      Marland Kitchen DIPHENHYDRAMINE HCL 2 % EX CREA Topical Apply 1 application topically 2 (two) times daily as needed. For rash    . ONE-DAILY MULTI VITAMINS PO TABS Oral Take 1 tablet by mouth daily.      Marland Kitchen SIMVASTATIN 40 MG PO TABS Oral Take 40 mg by mouth daily.    Marland Kitchen VALSARTAN-HYDROCHLOROTHIAZIDE 160-12.5 MG PO TABS Oral Take 1 tablet by mouth daily.       BP 174/99  Pulse 82  Temp 98.3 F (36.8 C) (Oral)  Resp 17  SpO2 100%  Physical Exam  Nursing note and vitals reviewed. Constitutional: He appears well-developed and well-nourished.  No distress.  HENT:  Head: Normocephalic.       No oral mucosa rash, no facial swelling  Eyes: Conjunctivae are normal.  Neck: Neck supple.  Cardiovascular: Normal rate, regular rhythm and normal heart sounds.   Pulmonary/Chest: Effort normal and breath sounds normal. No respiratory distress. He has no wheezes. He has no rales.  Skin: Skin is warm and dry.       erythemous papules over back, upper thigh, lower legs, distal arms.   Psychiatric: He has a normal mood and affect.    ED Course  Procedures (including critical care time)  Pt's rash consistent with insect bites, possible bed bugs. Will treat with anti histamines, hydrocortisone cream, follow up.    1. Multiple insect bites       MDM          Lottie Mussel, PA 06/07/12 0125

## 2012-06-06 NOTE — Discharge Instructions (Signed)
Your rash is consistent with insect bites, possible bed bugs. Start benadryl as prescribed. Take for 3 days or until rash resolved. Apply hydrocortisone cream to the bite marks. Wash all your clothing that you had with your on the trip with hot water. Follow up with your doctor if not improving Bedbugs Bedbugs are tiny bugs that live in and around beds. During the day, they hide in mattresses and other places near beds. They come out at night and bite people lying in bed. They need blood to live and grow. Bedbugs can be found in beds anywhere. Usually, they are found in places where many people come and go (hotels, shelters, hospitals). It does not matter whether the place is dirty or clean. Getting bitten by bedbugs rarely causes a medical problem. The biggest problem can be getting rid of them. This often takes the work of a Oncologist. CAUSES  Less use of pesticides. Bedbugs were common before the 1950s. Then, strong pesticides such as DDT nearly wiped them out. Today, these pesticides are not used because they harm the environment and can cause health problems.   More travel. Besides mattresses, bedbugs can also live in clothing and luggage. They can come along as people travel from place to place. Bedbugs are more common in certain parts of the world. When people travel to those areas, the bugs can come home with them.   Presence of birds and bats. Bedbugs often infest birds and bats. If you have these animals in or near your home, bedbugs may infest your house, too.  SYMPTOMS It does not hurt to be bitten by a bedbug. You will probably not wake up when you are bitten. Bedbugs usually bite areas of the skin that are not covered. Symptoms may show when you wake up, or they may take a day or more to show up. Symptoms may include:  Small red bumps on the skin. These might be lined up in a row or clustered in a group.   A darker red dot in the middle of red bumps.   Blisters on the skin.  There may be swelling and very bad itching. These may be signs of an allergic reaction. This does not happen often.  DIAGNOSIS Bedbug bites might look and feel like other types of insect bites. The bugs do not stay on the body like ticks or lice. They bite, drop off, and crawl away to hide. Your caregiver will probably:  Ask about your symptoms.   Ask about your recent activities and travel.   Check your skin for bedbug bites.   Ask you to check at home for signs of bedbugs. You should look for:   Spots or stains on the bed or nearby. This could be from bedbugs that were crushed or from their eggs or waste.   Bedbugs themselves. They are reddish-brown, oval, and flat. They do not fly. They are about the size of an apple seed.   Places to look for bedbugs include:   Beds. Check mattresses, headboards, box springs, and bed frames.   On drapes and curtains near the bed.   Under carpeting in the bedroom.   Behind electrical outlets.   Behind any wallpaper that is peeling.   Inside luggage.  TREATMENT Most bedbug bites do not need treatment. They usually go away on their own in a few days. The bites are not dangerous. However, treatment may be needed if you have scratched so much that your skin has become  infected. You may also need treatment if you are allergic to bedbug bites. Treatment options include:  A drug that stops swelling and itching (corticosteroid). Usually, a cream is rubbed on the skin. If you have a bad rash, you may be given a corticosteroid pill.   Oral antihistamines. These are pills to help control itching.   Antibiotic medicines. An antibiotic may be prescribed for infected skin.  HOME CARE INSTRUCTIONS   Take any medicine prescribed by your caregiver for your bites. Follow the directions carefully.   Consider wearing pajamas with long sleeves and pant legs.   Your bedroom may need to be treated. A pest control expert should make sure the bedbugs are gone.  You may need to throw away mattresses or luggage. Ask the pest control expert what you can do to keep the bedbugs from coming back. Common suggestions include:   Putting a plastic cover over your mattress.   Washing and drying your clothes and bedding in hot water and a hot dryer. The temperature should be hotter than 120 F (48.9 C). Bedbugs are killed by high temperatures.   Vacuuming carefully all around your bed. Vacuum in all cracks and crevices where the bugs might hide. Do this often.   Carefully checking all used furniture, bedding, or clothes that you bring into your house.   Eliminating bird nests and bat roosts.   If you get bedbug bites when traveling, check all your possessions carefully before bringing them into your house. If you find any bugs on clothes or in your luggage, consider throwing those items away.  SEEK MEDICAL CARE IF:  You have red bug bites that keep coming back.   You have red bug bites that itch badly.   You have bug bites that cause a skin rash.   You have scratch marks that are red and sore.  SEEK IMMEDIATE MEDICAL CARE IF: You have a fever. Document Released: 01/10/2011 Document Revised: 11/27/2011 Document Reviewed: 01/10/2011 [facility] Patient Information 2012 [place], [place].

## 2012-06-06 NOTE — ED Notes (Signed)
Pt c/o "bites" all over and itching.  States he stayed at a hotel at [facility] last night and thinks he may have bed bugs.

## 2012-06-15 NOTE — ED Provider Notes (Signed)
Medical screening examination/treatment/procedure(s) were performed by non-physician practitioner and as supervising physician I was immediately available for consultation/collaboration.  Donnetta Hutching, MD 06/15/12 [phone]

## 2012-06-29 ENCOUNTER — Emergency Department (HOSPITAL_COMMUNITY)
Admission: EM | Admit: 2012-06-29 | Discharge: 2012-07-01 | Disposition: A | Discharge status: Home / Self Care | Payer: BC Managed Care – PPO | Attending: Emergency Medicine | Admitting: Emergency Medicine

## 2012-06-29 DIAGNOSIS — Z0389 Encounter for observation for other suspected diseases and conditions ruled out: Secondary | ICD-10-CM | POA: Insufficient documentation

## 2012-06-30 NOTE — ED Notes (Signed)
Patient is resting comfortably. 

## 2012-07-14 ENCOUNTER — Telehealth (INDEPENDENT_AMBULATORY_CARE_PROVIDER_SITE_OTHER): Payer: Self-pay

## 2012-07-14 NOTE — Telephone Encounter (Signed)
Patient walked in to our office complaining of incision pain-- patient's previous surgical history indicates he had a Lipoma excised (in 2012 by Dr. Zachery Dakins.  Patient states he has recently had shoulder pain that radiates down his arm.  He is currently seeing an Orthopedist and a Chiropractorfor his Rotator Cuff and a Chiropractor, which felt he should see his surgeon for further assessment of shoulder pain.  Incision intact, no redness/swelling or draining, appears well healed.  Recommend patient follow up with his Orthopedist.

## 2013-04-28 ENCOUNTER — Other Ambulatory Visit: Payer: Self-pay | Admitting: Family Medicine

## 2013-04-28 DIAGNOSIS — R109 Unspecified abdominal pain: Secondary | ICD-10-CM

## 2013-05-04 ENCOUNTER — Ambulatory Visit
Admission: RE | Admit: 2013-05-04 | Discharge: 2013-05-04 | Disposition: A | Discharge status: Home / Self Care | Payer: BC Managed Care – PPO | Source: Ambulatory Visit | Attending: Family Medicine | Admitting: Family Medicine

## 2013-05-04 DIAGNOSIS — R109 Unspecified abdominal pain: Secondary | ICD-10-CM

## 2013-09-01 ENCOUNTER — Other Ambulatory Visit: Payer: Self-pay | Admitting: Gastroenterology

## 2013-09-01 DIAGNOSIS — R141 Gas pain: Secondary | ICD-10-CM

## 2013-09-06 ENCOUNTER — Other Ambulatory Visit: Payer: BC Managed Care – PPO

## 2013-09-15 ENCOUNTER — Ambulatory Visit
Admission: RE | Admit: 2013-09-15 | Discharge: 2013-09-15 | Disposition: A | Discharge status: Home / Self Care | Payer: BC Managed Care – PPO | Source: Ambulatory Visit | Attending: Gastroenterology | Admitting: Gastroenterology

## 2013-09-15 DIAGNOSIS — R141 Gas pain: Secondary | ICD-10-CM

## 2013-10-21 ENCOUNTER — Emergency Department (HOSPITAL_COMMUNITY): Payer: BC Managed Care – PPO

## 2013-10-21 ENCOUNTER — Emergency Department (HOSPITAL_COMMUNITY)
Admission: EM | Admit: 2013-10-21 | Discharge: 2013-10-21 | Disposition: A | Discharge status: Home / Self Care | Payer: BC Managed Care – PPO | Attending: Emergency Medicine | Admitting: Emergency Medicine

## 2013-10-21 ENCOUNTER — Encounter (HOSPITAL_COMMUNITY): Payer: Self-pay | Admitting: Emergency Medicine

## 2013-10-21 DIAGNOSIS — S8990XA Unspecified injury of unspecified lower leg, initial encounter: Secondary | ICD-10-CM | POA: Insufficient documentation

## 2013-10-21 DIAGNOSIS — Y990 Civilian activity done for income or pay: Secondary | ICD-10-CM | POA: Insufficient documentation

## 2013-10-21 DIAGNOSIS — Z8719 Personal history of other diseases of the digestive system: Secondary | ICD-10-CM | POA: Insufficient documentation

## 2013-10-21 DIAGNOSIS — Z7982 Long term (current) use of aspirin: Secondary | ICD-10-CM | POA: Insufficient documentation

## 2013-10-21 DIAGNOSIS — Y9389 Activity, other specified: Secondary | ICD-10-CM | POA: Insufficient documentation

## 2013-10-21 DIAGNOSIS — R609 Edema, unspecified: Secondary | ICD-10-CM | POA: Insufficient documentation

## 2013-10-21 DIAGNOSIS — M109 Gout, unspecified: Secondary | ICD-10-CM | POA: Insufficient documentation

## 2013-10-21 DIAGNOSIS — Y9289 Other specified places as the place of occurrence of the external cause: Secondary | ICD-10-CM | POA: Insufficient documentation

## 2013-10-21 DIAGNOSIS — IMO0002 Reserved for concepts with insufficient information to code with codable children: Secondary | ICD-10-CM | POA: Insufficient documentation

## 2013-10-21 DIAGNOSIS — I10 Essential (primary) hypertension: Secondary | ICD-10-CM | POA: Insufficient documentation

## 2013-10-21 DIAGNOSIS — Z79899 Other long term (current) drug therapy: Secondary | ICD-10-CM | POA: Insufficient documentation

## 2013-10-21 DIAGNOSIS — M25562 Pain in left knee: Secondary | ICD-10-CM

## 2013-10-21 DIAGNOSIS — E785 Hyperlipidemia, unspecified: Secondary | ICD-10-CM | POA: Insufficient documentation

## 2013-10-21 MED ORDER — OXYCODONE-ACETAMINOPHEN 5-325 MG PO TABS
2.0000 | ORAL_TABLET | Freq: Once | ORAL | Status: AC
  Administered 2013-10-21: 2 via ORAL
  Filled 2013-10-21: qty 2

## 2013-10-21 MED ORDER — PREDNISONE 20 MG PO TABS: 60.0000 mg | ORAL_TABLET | Freq: Every day | ORAL | Status: DC

## 2013-10-21 MED ORDER — PREDNISONE 20 MG PO TABS
60.0000 mg | ORAL_TABLET | Freq: Once | ORAL | Status: AC
  Administered 2013-10-21: 60 mg via ORAL
  Filled 2013-10-21: qty 3

## 2013-10-21 MED ORDER — OXYCODONE-ACETAMINOPHEN 5-325 MG PO TABS: 2.0000 | ORAL_TABLET | ORAL | Status: DC | PRN

## 2013-10-21 MED ORDER — INDOMETHACIN 25 MG PO CAPS: 25.0000 mg | ORAL_CAPSULE | Freq: Three times a day (TID) | ORAL | Status: DC | PRN

## 2013-10-21 MED ORDER — INDOMETHACIN 25 MG PO CAPS
50.0000 mg | ORAL_CAPSULE | Freq: Once | ORAL | Status: DC
  Filled 2013-10-21: qty 2

## 2013-10-21 NOTE — ED Notes (Signed)
Pt states he started having left knee pain starting around noon.  Pt states he hit his knee at work on Tuesday, but did not have any issue until yesterday.

## 2013-10-21 NOTE — ED Provider Notes (Signed)
CSN: [id number]     Arrival date & time 10/21/13  0358 History   First MD Initiated Contact with Patient 10/21/13 0411     Chief Complaint  Patient presents with  . Knee Pain   (Consider location/radiation/quality/duration/timing/severity/associated sxs/prior Treatment) HPI 47 year old male presents to emergency room with complaint of left knee pain.  He reports on Wednesday.  He fell onto his left knee.  No weakness or pain.  At that time.  Thursday morning when he woke up and he felt a little swollen and warm..  Today, the pain has gotten worse.  Pain is now significant, and he cannot walk.  He has history of gout, but is never had in his knee.  No fevers no chills.   Past Medical History  Diagnosis Date  . 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 Fatigue   . Hyperlipidemia   . Hernia   . Left shoulder pain     pt having pain and stiffness   . Low back pain     S/P EPI INJECTION  . Urinary frequency   . Hypertension     REQUESTED STRESS TEST JAN 2012  (PER PT HAD DONE)AT [facility] (-- THEY HAVE NO RECORDS PER MED. REC.   Past Surgical History  Procedure Laterality Date  . Reduction of torsion of testis  20 YRS AGO    LEFT  . Hernia repair  1998    left inguinal hernia   . Lipoma excision  11/20/2011    Procedure: EXCISION LIPOMA;  Surgeon: Iona Coach, MD;  Location[facility];  Service: General;  Laterality: Left;   Family History  Problem Relation Age of Onset  . Cancer Father     liver   History  Substance Use Topics  . Smoking status: Never Smoker   . Smokeless tobacco: Never Used  . Alcohol Use: Yes     Comment: six beers per week or more    Review of Systems  All other systems reviewed and are negative.    Allergies  Review of patient's allergies indicates no known allergies.  Home Medications   Current Outpatient Rx  Name  Route  Sig  Dispense  Refill  . aspirin EC 81 MG tablet   Oral   Take 81 mg  by mouth daily.         . cholecalciferol (VITAMIN D) 1000 UNITS tablet   Oral   Take 1,000 Units by mouth daily.           . diphenhydrAMINE (BENADRYL) 2 % cream   Topical   Apply 1 application topically 2 (two) times daily as needed. For rash         . EXPIRED: diphenhydrAMINE (BENADRYL) 25 MG tablet   Oral   Take 1 tablet (25 mg total) by mouth every 6 (six) hours.   20 tablet   0   . indomethacin (INDOCIN) 25 MG capsule   Oral   Take 1 capsule (25 mg total) by mouth 3 (three) times daily as needed.   30 capsule   0   . Multiple Vitamin (MULTIVITAMIN) tablet   Oral   Take 1 tablet by mouth daily.           Marland Kitchen oxyCODONE-acetaminophen (PERCOCET/ROXICET) 5-325 MG per tablet   Oral   Take 2 tablets by mouth every 4 (four) hours as needed for pain.   20 tablet   0   .  predniSONE (DELTASONE) 20 MG tablet   Oral   Take 3 tablets (60 mg total) by mouth daily.   15 tablet   0   . simvastatin (ZOCOR) 40 MG tablet   Oral   Take 40 mg by mouth daily.         . valsartan-hydrochlorothiazide (DIOVAN-HCT) 160-12.5 MG per tablet   Oral   Take 1 tablet by mouth daily.           BP 147/79  Pulse 76  Temp(Src) 98 F (36.7 C) (Oral)  Resp 19  SpO2 99% Physical Exam  Constitutional: He is oriented to person, place, and time. He appears well-developed and well-nourished. He appears distressed.  Musculoskeletal: He exhibits edema and tenderness.  Left knee:  Trace effusion noted to left lateral knee, no ballotment.  Warmth, pain with light touch, ROM.  No erythema.    Neurological: He is alert and oriented to person, place, and time.  Skin: Skin is warm and dry. No rash noted. No erythema. No pallor.    ED Course  Procedures (including critical care time) Labs Review Labs Reviewed - No data to display Imaging Review Dg Knee Complete 4 Views Left  10/21/2013   CLINICAL DATA:  Knee pain. Recent history of fall  EXAM: LEFT KNEE - COMPLETE 4+ VIEW  COMPARISON:   None.  FINDINGS: There is suggestion of mild prepatellar soft tissue swelling. Mineralization in the region of the distal quadriceps is likely chronic and incidental. No evidence of fracture, malalignment, joint narrowing, or osseous erosion.  IMPRESSION: No acute osseous findings.   Electronically Signed   By: Tiburcio Pea M.D.   On: 10/21/2013 05:05    EKG Interpretation   None       MDM   1. Left knee pain   2. Gout    47 yo male with left knee pain.  No fracture or dislocation on xray.  Probable gout given exam.  Will start on indomethacin, prednisone, percocet    Olivia Mackie, MD 10/21/13 [phone]

## 2014-08-19 ENCOUNTER — Emergency Department (HOSPITAL_COMMUNITY)
Admission: EM | Admit: 2014-08-19 | Discharge: 2014-08-19 | Disposition: A | Discharge status: Home / Self Care | Payer: BC Managed Care – PPO | Attending: Emergency Medicine | Admitting: Emergency Medicine

## 2014-08-19 ENCOUNTER — Emergency Department (HOSPITAL_COMMUNITY): Payer: BC Managed Care – PPO

## 2014-08-19 ENCOUNTER — Encounter (HOSPITAL_COMMUNITY): Payer: Self-pay | Admitting: Emergency Medicine

## 2014-08-19 DIAGNOSIS — Z7982 Long term (current) use of aspirin: Secondary | ICD-10-CM | POA: Diagnosis not present

## 2014-08-19 DIAGNOSIS — Z8719 Personal history of other diseases of the digestive system: Secondary | ICD-10-CM | POA: Insufficient documentation

## 2014-08-19 DIAGNOSIS — Z79899 Other long term (current) drug therapy: Secondary | ICD-10-CM | POA: Insufficient documentation

## 2014-08-19 DIAGNOSIS — I10 Essential (primary) hypertension: Secondary | ICD-10-CM | POA: Diagnosis not present

## 2014-08-19 DIAGNOSIS — R0789 Other chest pain: Secondary | ICD-10-CM

## 2014-08-19 DIAGNOSIS — E785 Hyperlipidemia, unspecified: Secondary | ICD-10-CM | POA: Diagnosis not present

## 2014-08-19 DIAGNOSIS — R071 Chest pain on breathing: Secondary | ICD-10-CM | POA: Diagnosis not present

## 2014-08-19 LAB — BASIC METABOLIC PANEL
Anion gap: 13 (ref 5–15)
BUN: 18 mg/dL (ref 6–23)
CO2: 26 mEq/L (ref 19–32)
Calcium: 9.7 mg/dL (ref 8.4–10.5)
Chloride: 101 mEq/L (ref 96–112)
Creatinine, Ser: 1.13 mg/dL (ref 0.50–1.35)
GFR calc Af Amer: 86 mL/min — ABNORMAL LOW (ref >90)
GFR calc non Af Amer: 74 mL/min — ABNORMAL LOW (ref >90)
Glucose, Bld: 80 mg/dL (ref 70–99)
Potassium: 3.4 mEq/L — ABNORMAL LOW (ref 3.7–5.3)
Sodium: 140 mEq/L (ref 137–147)

## 2014-08-19 LAB — CBC
HCT: 38.5 % — ABNORMAL LOW (ref 39.0–52.0)
Hemoglobin: 13.5 g/dL (ref 13.0–17.0)
MCH: 31.3 pg (ref 26.0–34.0)
MCHC: 35.1 g/dL (ref 30.0–36.0)
MCV: 89.3 fL (ref 78.0–100.0)
Platelets: 303 10*3/uL (ref 150–400)
RBC: 4.31 MIL/uL (ref 4.22–5.81)
RDW: 12.6 % (ref 11.5–15.5)
WBC: 5 10*3/uL (ref 4.0–10.5)

## 2014-08-19 LAB — I-STAT TROPONIN, ED: Troponin i, poc: 0 ng/mL (ref 0.00–0.08)

## 2014-08-19 NOTE — ED Notes (Signed)
Patient transported to X-ray 

## 2014-08-19 NOTE — ED Provider Notes (Signed)
CSN: [id number]     Arrival date & time 08/19/14  1713 History   First MD Initiated Contact with Patient 08/19/14 1838     Chief Complaint  Patient presents with  . Chest Pain     (Consider location/radiation/quality/duration/timing/severity/associated sxs/prior Treatment) HPI  48yM with CP. L anterior chest. Gradual onset Monday. Constant since. Sharp and worse with certain movements/touch. No rash. Denies trauma, but recently doing pushups in effort to get into better shape. No respiratory complaints. No n/v. No unusual leg pain or swelling. No fever or chills.   Past Medical History  Diagnosis Date  . 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 Fatigue   . Hyperlipidemia   . Hernia   . Left shoulder pain     pt having pain and stiffness   . Low back pain     S/P EPI INJECTION  . Urinary frequency   . Hypertension     REQUESTED STRESS TEST JAN 2012  (PER PT HAD DONE)AT [facility] (-- THEY HAVE NO RECORDS PER MED. REC.   Past Surgical History  Procedure Laterality Date  . Reduction of torsion of testis  20 YRS AGO    LEFT  . Hernia repair  1998    left inguinal hernia   . Lipoma excision  11/20/2011    Procedure: EXCISION LIPOMA;  Surgeon: Willey Blade, MD;  Location: [facility];  Service: General;  Laterality: Left;   Family History  Problem Relation Age of Onset  . Cancer Father     liver   History  Substance Use Topics  . Smoking status: Never Smoker   . Smokeless tobacco: Never Used  . Alcohol Use: Yes     Comment: six beers per week or more    Review of Systems   All systems reviewed and negative, other than as noted in HPI.  Allergies  Review of patient's allergies indicates no known allergies.  Home Medications   Prior to Admission medications   Medication Sig Start Date End Date Taking? Authorizing Provider  allopurinol (ZYLOPRIM) 100 MG tablet Take 200 mg by mouth daily.   Yes Historical Provider, MD    aspirin EC 81 MG tablet Take 81 mg by mouth daily.   Yes Historical Provider, MD  cholecalciferol (VITAMIN D) 1000 UNITS tablet Take 1,000 Units by mouth daily.     Yes Historical Provider, MD  diphenhydrAMINE (BENADRYL) 2 % cream Apply 1 application topically 2 (two) times daily as needed. For rash   Yes Historical Provider, MD  Multiple Vitamin (MULTIVITAMIN) tablet Take 1 tablet by mouth daily.     Yes Historical Provider, MD  simvastatin (ZOCOR) 40 MG tablet Take 40 mg by mouth daily.   Yes Historical Provider, MD  valsartan-hydrochlorothiazide (DIOVAN-HCT) 160-25 MG per tablet Take 1 tablet by mouth daily.   Yes Historical Provider, MD  diphenhydrAMINE (BENADRYL) 25 MG tablet Take 1 tablet (25 mg total) by mouth every 6 (six) hours. 06/06/12 07/06/12  Tatyana A Kirichenko, PA-C   BP 131/77  Pulse 52  Temp(Src) 97.8 F (36.6 C) (Oral)  Resp 18  Ht 5\' 11"  (1.803 m)  Wt 209 lb (94.802 kg)  BMI 29.16 kg/m2  SpO2 98% Physical Exam  Nursing note and vitals reviewed. Constitutional: He appears well-developed and well-nourished. No distress.  HENT:  Head: Normocephalic and atraumatic.  Eyes: Conjunctivae are normal. Right eye exhibits no discharge. Left eye exhibits no discharge.  Neck: Neck  supple.  Cardiovascular: Normal rate, regular rhythm and normal heart sounds.  Exam reveals no gallop and no friction rub.   No murmur heard. Pulmonary/Chest: Effort normal and breath sounds normal. No respiratory distress. He exhibits tenderness.    TTP in depicted area. No crepitus. No skin lesions.   Abdominal: Soft. He exhibits no distension. There is no tenderness.  Musculoskeletal: He exhibits no edema and no tenderness.  Lower extremities symmetric as compared to each other. No calf tenderness. Negative Homan's. No palpable cords.   Neurological: He is alert.  Skin: Skin is warm and dry.  Psychiatric: He has a normal mood and affect. His behavior is normal. Thought content normal.    ED  Course  Procedures (including critical care time) Labs Review Labs Reviewed  CBC - Abnormal; Notable for the following:    HCT 38.5 (*)    All other components within normal limits  BASIC METABOLIC PANEL - Abnormal; Notable for the following:    Potassium 3.4 (*)    GFR calc non Af Amer 74 (*)    GFR calc Af Amer 86 (*)    All other components within normal limits  I-STAT TROPOININ, ED    Imaging Review Dg Chest 2 View  08/19/2014   CLINICAL DATA:  Left chest pain, shortness of breath.  EXAM: CHEST  2 VIEW  COMPARISON:  11/20/2011  FINDINGS: Heart and mediastinal contours are within normal limits. No focal opacities or effusions. No acute bony abnormality. Severe rightward scoliosis in the mid thoracic spine.  IMPRESSION: No active cardiopulmonary disease.   Electronically Signed   By: Rolm Baptise M.D.   On: 08/19/2014 19:42     EKG Interpretation   Date/Time:  Saturday August 19 2014 17:17:31 EDT Ventricular Rate:  75 PR Interval:  158 QRS Duration: 82 QT Interval:  368 QTC Calculation: 410 R Axis:   139 Text Interpretation:   Suspect arm lead reversal, interpretation  assumes no reversal Normal sinus rhythm Lateral infarct , age undetermined    Non-specific ST-t changes Confirmed by Wilson Singer  MD, [place] ([id number]) on  08/19/2014 7:21:54 PM      MDM   Final diagnoses:  Chest wall pain   48 year old male with chest pain.Marland Kitchen Atypical for ACS given constant duration for several days and reproducibility with palpation. Doubt pulmonary embolism, dissection, serious infection or other potential emergent etiology. Most consistent with chest wall pain, likely costochondritis. Workup today has been fairly unremarkable. He appears well. Reports that he recently began doing some pushups in effort to start exercising more. This potentially could be the precipitant. He also reports that he was recently started on Lipitor. Denies any pain/myalgias anywhere else. At this point his symptoms are  fairly mild. He reports an upcoming appointment with his PCP on September 10. I do not have a strong inclination to have him stop taking his Lipitor at this time, but discussed the need to discuss this with his PCP if he has continued symptoms.   Virgel Manifold, MD 08/23/14 1252

## 2014-08-19 NOTE — ED Notes (Signed)
Patient returned from X-ray 

## 2014-08-19 NOTE — ED Notes (Signed)
Pt reports L side CP since Monday.   Pt rates pain 4/10 and describes as sharp and constant.

## 2014-08-19 NOTE — Discharge Instructions (Signed)

## 2014-10-13 ENCOUNTER — Encounter: Payer: Self-pay | Admitting: *Deleted

## 2015-03-02 ENCOUNTER — Other Ambulatory Visit: Payer: Self-pay | Admitting: General Surgery

## 2015-06-13 ENCOUNTER — Ambulatory Visit
Admission: RE | Admit: 2015-06-13 | Discharge: 2015-06-13 | Disposition: A | Discharge status: Home / Self Care | Payer: BLUE CROSS/BLUE SHIELD | Source: Ambulatory Visit | Attending: Family Medicine | Admitting: Family Medicine

## 2015-06-13 ENCOUNTER — Other Ambulatory Visit: Payer: Self-pay | Admitting: Family Medicine

## 2015-06-13 DIAGNOSIS — M542 Cervicalgia: Secondary | ICD-10-CM

## 2016-03-28 ENCOUNTER — Encounter (HOSPITAL_COMMUNITY): Payer: Self-pay | Admitting: *Deleted

## 2016-03-28 ENCOUNTER — Emergency Department (HOSPITAL_COMMUNITY)
Admission: EM | Admit: 2016-03-28 | Discharge: 2016-03-29 | Disposition: A | Discharge status: Home / Self Care | Payer: BLUE CROSS/BLUE SHIELD | Attending: Emergency Medicine | Admitting: Emergency Medicine

## 2016-03-28 DIAGNOSIS — Z9889 Other specified postprocedural states: Secondary | ICD-10-CM | POA: Insufficient documentation

## 2016-03-28 DIAGNOSIS — I10 Essential (primary) hypertension: Secondary | ICD-10-CM | POA: Diagnosis not present

## 2016-03-28 DIAGNOSIS — Z8719 Personal history of other diseases of the digestive system: Secondary | ICD-10-CM | POA: Diagnosis not present

## 2016-03-28 DIAGNOSIS — R319 Hematuria, unspecified: Secondary | ICD-10-CM | POA: Diagnosis present

## 2016-03-28 DIAGNOSIS — Z86018 Personal history of other benign neoplasm: Secondary | ICD-10-CM | POA: Diagnosis not present

## 2016-03-28 DIAGNOSIS — Z7982 Long term (current) use of aspirin: Secondary | ICD-10-CM | POA: Diagnosis not present

## 2016-03-28 DIAGNOSIS — R300 Dysuria: Secondary | ICD-10-CM | POA: Insufficient documentation

## 2016-03-28 DIAGNOSIS — Z79899 Other long term (current) drug therapy: Secondary | ICD-10-CM | POA: Diagnosis not present

## 2016-03-28 DIAGNOSIS — E785 Hyperlipidemia, unspecified: Secondary | ICD-10-CM | POA: Insufficient documentation

## 2016-03-28 LAB — URINALYSIS, ROUTINE W REFLEX MICROSCOPIC
Bilirubin Urine: NEGATIVE
Glucose, UA: NEGATIVE mg/dL
Hgb urine dipstick: NEGATIVE
Ketones, ur: NEGATIVE mg/dL
Leukocytes, UA: NEGATIVE
Nitrite: NEGATIVE
PROTEIN: NEGATIVE mg/dL
SPECIFIC GRAVITY, URINE: 1.017 (ref 1.005–1.030)
pH: 6.5 (ref 5.0–8.0)

## 2016-03-28 NOTE — ED Notes (Signed)
The pt is c/o a burning  Pain in his penis for 4-5 days

## 2016-03-29 LAB — RPR: RPR Ser Ql: NONREACTIVE

## 2016-03-29 MED ORDER — CEFTRIAXONE SODIUM 250 MG IJ SOLR
250.0000 mg | Freq: Once | INTRAMUSCULAR | Status: AC
  Administered 2016-03-29: 250 mg via INTRAMUSCULAR
  Filled 2016-03-29: qty 250

## 2016-03-29 MED ORDER — LIDOCAINE HCL (PF) 1 % IJ SOLN
2.0000 mL | Freq: Once | INTRAMUSCULAR | Status: AC
  Administered 2016-03-29: 2 mL via INTRADERMAL
  Filled 2016-03-29: qty 5

## 2016-03-29 MED ORDER — AZITHROMYCIN 250 MG PO TABS
1000.0000 mg | ORAL_TABLET | Freq: Once | ORAL | Status: AC
  Administered 2016-03-29: 1000 mg via ORAL
  Filled 2016-03-29: qty 4

## 2016-03-29 NOTE — ED Provider Notes (Signed)
CSN: [id number]     Arrival date & time 03/28/16  2311 History   First MD Initiated Contact with Kenneth Logan 03/29/16 0006     Chief Complaint  Kenneth Logan presents with  . Hematuria     (Consider location/radiation/quality/duration/timing/severity/associated sxs/prior Treatment) Kenneth Logan is a 50 y.o. male presenting with dysuria.  Dysuria This is a new problem. The current episode started in the past 7 days. The problem occurs intermittently. The problem has been gradually worsening.   Kenneth Logan is a 50 y.o. male who presents to the ED with dysuria. The symptoms started 4 or 5 days ago after unprotected intercourse with a new sex partner. Kenneth Logan denies any other symptoms.  Kenneth Logan does report that years ago he had something similar and he eventually went to a urologist but doesn't know what they treated. He states he did not have an STD at at that time.   Past Medical History  Diagnosis Date  . 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 Fatigue   . Hyperlipidemia   . Hernia   . Left shoulder pain     pt having pain and stiffness   . Low back pain     S/P EPI INJECTION  . Urinary frequency   . Hypertension     REQUESTED STRESS TEST JAN 2012  (PER PT HAD DONE)AT [facility] (-- THEY HAVE NO RECORDS PER MED. REC.   Past Surgical History  Procedure Laterality Date  . Reduction of torsion of testis  20 YRS AGO    LEFT  . Hernia repair  1998    left inguinal hernia   . Lipoma excision  11/20/2011    Procedure: EXCISION LIPOMA;  Surgeon: Willey Blade, MD;  Location: [facility];  Service: General;  Laterality: Left;   Family History  Problem Relation Age of Onset  . Cancer Father     liver   Social History  Substance Use Topics  . Smoking status: Never Smoker   . Smokeless tobacco: Never Used  . Alcohol Use: Yes     Comment: six beers per week or more    Review of Systems  Genitourinary: Positive for dysuria.  all other systems  negative    Allergies  Review of Kenneth Logan's allergies indicates no known allergies.  Home Medications   Prior to Admission medications   Medication Sig Start Date End Date Taking? Authorizing Provider  allopurinol (ZYLOPRIM) 100 MG tablet Take 200 mg by mouth daily.    Historical Provider, MD  aspirin EC 81 MG tablet Take 81 mg by mouth daily.    Historical Provider, MD  cholecalciferol (VITAMIN D) 1000 UNITS tablet Take 1,000 Units by mouth daily.      Historical Provider, MD  diphenhydrAMINE (BENADRYL) 2 % cream Apply 1 application topically 2 (two) times daily as needed. For rash    Historical Provider, MD  diphenhydrAMINE (BENADRYL) 25 MG tablet Take 1 tablet (25 mg total) by mouth every 6 (six) hours. 06/06/12 07/06/12  Tatyana Kirichenko, PA-C  Multiple Vitamin (MULTIVITAMIN) tablet Take 1 tablet by mouth daily.      Historical Provider, MD  simvastatin (ZOCOR) 40 MG tablet Take 40 mg by mouth daily.    Historical Provider, MD  valsartan-hydrochlorothiazide (DIOVAN-HCT) 160-25 MG per tablet Take 1 tablet by mouth daily.    Historical Provider, MD   BP 147/90 mmHg  Pulse 77  Temp(Src) 97.8 F (36.6 C) (Oral)  Resp 16  SpO2 97%  Physical Exam  Constitutional: He is oriented to person, place, and time. He appears well-developed and well-nourished.  HENT:  Head: Normocephalic.  Eyes: EOM are normal.  Neck: Neck supple.  Cardiovascular: Normal rate.   Pulmonary/Chest: Effort normal.  Abdominal: Soft. There is no tenderness.  Genitourinary: Testes normal. Circumcised. No paraphimosis, penile erythema or penile tenderness.  Small amount of clear d/c   Musculoskeletal: Normal range of motion.  Lymphadenopathy:       Right: No inguinal adenopathy present.       Left: No inguinal adenopathy present.  Neurological: He is alert and oriented to person, place, and time. No cranial nerve deficit.  Skin: Skin is warm and dry.  Psychiatric: He has a normal mood and affect. His behavior is  normal.  Nursing note and vitals reviewed.   ED Course  Procedures (including critical care time) Cultures for GC and Chlamydia collected, blood drawn for HIV and RPR Rocephin 250 mg IM and Zithromax 1 gram PO  Labs Review Results for orders placed or performed during the hospital encounter of 03/28/16 (from the past 24 hour(s))  Urinalysis, Routine w reflex microscopic (not at [facility])     Status: None   Collection Time: 03/28/16 11:30 PM  Result Value Ref Range   Color, Urine YELLOW YELLOW   APPearance CLEAR CLEAR   Specific Gravity, Urine 1.017 1.005 - 1.030   pH 6.5 5.0 - 8.0   Glucose, UA NEGATIVE NEGATIVE mg/dL   Hgb urine dipstick NEGATIVE NEGATIVE   Bilirubin Urine NEGATIVE NEGATIVE   Ketones, ur NEGATIVE NEGATIVE mg/dL   Protein, ur NEGATIVE NEGATIVE mg/dL   Nitrite NEGATIVE NEGATIVE   Leukocytes, UA NEGATIVE NEGATIVE    Imaging Review  MDM  50 y.o. male with dysuria after unprotected sex with new male sex partner. Urine normal, Kenneth Logan treated for possible Chlamydia/GC with cultures pending. Discussed with the Kenneth Logan and all questioned fully answered. He will f/u with urology if symptoms persist. Discussed with the Kenneth Logan that he will only get a call if any test are positive.    Final diagnoses:  Dysuria       [facility], NP 03/29/16 [place], MD 03/29/16 [phone]

## 2016-03-29 NOTE — Discharge Instructions (Signed)
If your cultures are positive someone will call you. If your symptoms persist follow up with Dr. Jeffie Pollock.

## 2016-03-30 LAB — HIV ANTIBODY (ROUTINE TESTING W REFLEX): HIV Screen 4th Generation wRfx: NONREACTIVE

## 2016-03-31 LAB — GC/CHLAMYDIA PROBE AMP (~~LOC~~) NOT AT ARMC
CHLAMYDIA, DNA PROBE: NEGATIVE
Neisseria Gonorrhea: NEGATIVE

## 2018-01-13 ENCOUNTER — Other Ambulatory Visit: Payer: Self-pay

## 2018-03-25 DIAGNOSIS — M5136 Other intervertebral disc degeneration, lumbar region: Secondary | ICD-10-CM | POA: Diagnosis not present

## 2018-04-13 DIAGNOSIS — E7800 Pure hypercholesterolemia, unspecified: Secondary | ICD-10-CM | POA: Diagnosis not present

## 2018-04-13 DIAGNOSIS — M109 Gout, unspecified: Secondary | ICD-10-CM | POA: Diagnosis not present

## 2018-04-13 DIAGNOSIS — I10 Essential (primary) hypertension: Secondary | ICD-10-CM | POA: Diagnosis not present

## 2018-06-26 DIAGNOSIS — I10 Essential (primary) hypertension: Secondary | ICD-10-CM | POA: Diagnosis not present

## 2018-06-26 DIAGNOSIS — M109 Gout, unspecified: Secondary | ICD-10-CM | POA: Diagnosis not present

## 2018-06-28 DIAGNOSIS — I10 Essential (primary) hypertension: Secondary | ICD-10-CM | POA: Diagnosis not present

## 2018-06-28 DIAGNOSIS — M109 Gout, unspecified: Secondary | ICD-10-CM | POA: Diagnosis not present

## 2018-07-05 DIAGNOSIS — E7800 Pure hypercholesterolemia, unspecified: Secondary | ICD-10-CM | POA: Diagnosis not present

## 2018-07-05 DIAGNOSIS — I10 Essential (primary) hypertension: Secondary | ICD-10-CM | POA: Diagnosis not present

## 2018-07-05 DIAGNOSIS — M109 Gout, unspecified: Secondary | ICD-10-CM | POA: Diagnosis not present

## 2018-08-12 DIAGNOSIS — I10 Essential (primary) hypertension: Secondary | ICD-10-CM | POA: Diagnosis not present

## 2018-08-12 DIAGNOSIS — M109 Gout, unspecified: Secondary | ICD-10-CM | POA: Diagnosis not present

## 2018-08-12 DIAGNOSIS — E782 Mixed hyperlipidemia: Secondary | ICD-10-CM | POA: Diagnosis not present

## 2019-02-15 DIAGNOSIS — M109 Gout, unspecified: Secondary | ICD-10-CM | POA: Diagnosis not present

## 2019-02-15 DIAGNOSIS — E782 Mixed hyperlipidemia: Secondary | ICD-10-CM | POA: Diagnosis not present

## 2019-02-15 DIAGNOSIS — M545 Low back pain: Secondary | ICD-10-CM | POA: Diagnosis not present

## 2019-02-15 DIAGNOSIS — I10 Essential (primary) hypertension: Secondary | ICD-10-CM | POA: Diagnosis not present

## 2019-02-23 DIAGNOSIS — M545 Low back pain: Secondary | ICD-10-CM | POA: Diagnosis not present

## 2019-02-23 DIAGNOSIS — R262 Difficulty in walking, not elsewhere classified: Secondary | ICD-10-CM | POA: Diagnosis not present

## 2019-02-23 DIAGNOSIS — M79605 Pain in left leg: Secondary | ICD-10-CM | POA: Diagnosis not present

## 2019-06-10 DIAGNOSIS — J069 Acute upper respiratory infection, unspecified: Secondary | ICD-10-CM | POA: Diagnosis not present

## 2019-08-18 DIAGNOSIS — E782 Mixed hyperlipidemia: Secondary | ICD-10-CM | POA: Diagnosis not present

## 2019-08-18 DIAGNOSIS — M109 Gout, unspecified: Secondary | ICD-10-CM | POA: Diagnosis not present

## 2019-08-18 DIAGNOSIS — I10 Essential (primary) hypertension: Secondary | ICD-10-CM | POA: Diagnosis not present

## 2019-08-18 DIAGNOSIS — Z125 Encounter for screening for malignant neoplasm of prostate: Secondary | ICD-10-CM | POA: Diagnosis not present

## 2019-09-11 DIAGNOSIS — R300 Dysuria: Secondary | ICD-10-CM | POA: Diagnosis not present

## 2019-11-15 DIAGNOSIS — R519 Headache, unspecified: Secondary | ICD-10-CM | POA: Diagnosis not present

## 2019-11-15 DIAGNOSIS — T148XXA Other injury of unspecified body region, initial encounter: Secondary | ICD-10-CM | POA: Diagnosis not present

## 2019-11-15 DIAGNOSIS — Z20828 Contact with and (suspected) exposure to other viral communicable diseases: Secondary | ICD-10-CM | POA: Diagnosis not present

## 2020-02-23 DIAGNOSIS — Z125 Encounter for screening for malignant neoplasm of prostate: Secondary | ICD-10-CM | POA: Diagnosis not present

## 2020-02-23 DIAGNOSIS — M545 Low back pain: Secondary | ICD-10-CM | POA: Diagnosis not present

## 2020-02-23 DIAGNOSIS — M109 Gout, unspecified: Secondary | ICD-10-CM | POA: Diagnosis not present

## 2020-02-23 DIAGNOSIS — I10 Essential (primary) hypertension: Secondary | ICD-10-CM | POA: Diagnosis not present

## 2020-02-23 DIAGNOSIS — E782 Mixed hyperlipidemia: Secondary | ICD-10-CM | POA: Diagnosis not present

## 2020-03-11 DIAGNOSIS — R300 Dysuria: Secondary | ICD-10-CM | POA: Diagnosis not present

## 2020-03-11 DIAGNOSIS — Z5321 Procedure and treatment not carried out due to patient leaving prior to being seen by health care provider: Secondary | ICD-10-CM | POA: Diagnosis not present

## 2020-03-26 DIAGNOSIS — S20211A Contusion of right front wall of thorax, initial encounter: Secondary | ICD-10-CM | POA: Diagnosis not present

## 2020-08-19 DIAGNOSIS — M25572 Pain in left ankle and joints of left foot: Secondary | ICD-10-CM | POA: Diagnosis not present

## 2020-08-19 DIAGNOSIS — M109 Gout, unspecified: Secondary | ICD-10-CM | POA: Diagnosis not present

## 2020-08-19 DIAGNOSIS — M7989 Other specified soft tissue disorders: Secondary | ICD-10-CM | POA: Diagnosis not present

## 2020-08-28 DIAGNOSIS — E782 Mixed hyperlipidemia: Secondary | ICD-10-CM | POA: Diagnosis not present

## 2020-08-28 DIAGNOSIS — I10 Essential (primary) hypertension: Secondary | ICD-10-CM | POA: Diagnosis not present

## 2020-08-28 DIAGNOSIS — M545 Low back pain: Secondary | ICD-10-CM | POA: Diagnosis not present

## 2020-08-28 DIAGNOSIS — M109 Gout, unspecified: Secondary | ICD-10-CM | POA: Diagnosis not present

## 2021-01-10 DIAGNOSIS — R059 Cough, unspecified: Secondary | ICD-10-CM | POA: Diagnosis not present

## 2021-01-10 DIAGNOSIS — J069 Acute upper respiratory infection, unspecified: Secondary | ICD-10-CM | POA: Diagnosis not present

## 2021-03-06 DIAGNOSIS — M109 Gout, unspecified: Secondary | ICD-10-CM | POA: Diagnosis not present

## 2021-03-06 DIAGNOSIS — I10 Essential (primary) hypertension: Secondary | ICD-10-CM | POA: Diagnosis not present

## 2021-03-06 DIAGNOSIS — E669 Obesity, unspecified: Secondary | ICD-10-CM | POA: Diagnosis not present

## 2021-03-06 DIAGNOSIS — E782 Mixed hyperlipidemia: Secondary | ICD-10-CM | POA: Diagnosis not present

## 2021-03-06 DIAGNOSIS — Z125 Encounter for screening for malignant neoplasm of prostate: Secondary | ICD-10-CM | POA: Diagnosis not present

## 2021-03-31 DIAGNOSIS — K120 Recurrent oral aphthae: Secondary | ICD-10-CM | POA: Diagnosis not present

## 2021-04-18 DIAGNOSIS — K121 Other forms of stomatitis: Secondary | ICD-10-CM | POA: Diagnosis not present

## 2021-05-24 DIAGNOSIS — B370 Candidal stomatitis: Secondary | ICD-10-CM | POA: Diagnosis not present

## 2021-06-04 DIAGNOSIS — K1370 Unspecified lesions of oral mucosa: Secondary | ICD-10-CM | POA: Diagnosis not present

## 2021-06-04 DIAGNOSIS — L309 Dermatitis, unspecified: Secondary | ICD-10-CM | POA: Diagnosis not present

## 2021-06-04 DIAGNOSIS — Z113 Encounter for screening for infections with a predominantly sexual mode of transmission: Secondary | ICD-10-CM | POA: Diagnosis not present

## 2021-07-02 DIAGNOSIS — K146 Glossodynia: Secondary | ICD-10-CM | POA: Diagnosis not present

## 2021-07-02 DIAGNOSIS — K219 Gastro-esophageal reflux disease without esophagitis: Secondary | ICD-10-CM | POA: Diagnosis not present

## 2021-07-11 DIAGNOSIS — K1370 Unspecified lesions of oral mucosa: Secondary | ICD-10-CM | POA: Diagnosis not present

## 2021-07-23 DIAGNOSIS — L859 Epidermal thickening, unspecified: Secondary | ICD-10-CM | POA: Diagnosis not present

## 2021-07-25 DIAGNOSIS — R350 Frequency of micturition: Secondary | ICD-10-CM | POA: Diagnosis not present

## 2021-08-11 DIAGNOSIS — R3989 Other symptoms and signs involving the genitourinary system: Secondary | ICD-10-CM | POA: Diagnosis not present

## 2021-09-09 DIAGNOSIS — N401 Enlarged prostate with lower urinary tract symptoms: Secondary | ICD-10-CM | POA: Diagnosis not present

## 2021-09-09 DIAGNOSIS — R300 Dysuria: Secondary | ICD-10-CM | POA: Diagnosis not present

## 2021-09-30 ENCOUNTER — Ambulatory Visit: Payer: BC Managed Care – PPO | Admitting: Podiatry

## 2021-10-28 DIAGNOSIS — N401 Enlarged prostate with lower urinary tract symptoms: Secondary | ICD-10-CM | POA: Diagnosis not present

## 2021-10-28 DIAGNOSIS — R3912 Poor urinary stream: Secondary | ICD-10-CM | POA: Diagnosis not present

## 2021-10-28 DIAGNOSIS — R300 Dysuria: Secondary | ICD-10-CM | POA: Diagnosis not present

## 2021-11-06 DIAGNOSIS — M79641 Pain in right hand: Secondary | ICD-10-CM | POA: Diagnosis not present

## 2021-11-10 DIAGNOSIS — N342 Other urethritis: Secondary | ICD-10-CM | POA: Diagnosis not present

## 2021-11-11 DIAGNOSIS — S60551D Superficial foreign body of right hand, subsequent encounter: Secondary | ICD-10-CM | POA: Diagnosis not present

## 2021-11-27 DIAGNOSIS — R300 Dysuria: Secondary | ICD-10-CM | POA: Diagnosis not present

## 2021-11-27 DIAGNOSIS — M6281 Muscle weakness (generalized): Secondary | ICD-10-CM | POA: Diagnosis not present

## 2021-11-27 DIAGNOSIS — M62838 Other muscle spasm: Secondary | ICD-10-CM | POA: Diagnosis not present

## 2021-11-27 DIAGNOSIS — M6289 Other specified disorders of muscle: Secondary | ICD-10-CM | POA: Diagnosis not present

## 2021-12-19 DIAGNOSIS — M6289 Other specified disorders of muscle: Secondary | ICD-10-CM | POA: Diagnosis not present

## 2021-12-19 DIAGNOSIS — M62838 Other muscle spasm: Secondary | ICD-10-CM | POA: Diagnosis not present

## 2021-12-19 DIAGNOSIS — M6281 Muscle weakness (generalized): Secondary | ICD-10-CM | POA: Diagnosis not present

## 2021-12-19 DIAGNOSIS — R300 Dysuria: Secondary | ICD-10-CM | POA: Diagnosis not present

## 2022-01-13 DIAGNOSIS — M6289 Other specified disorders of muscle: Secondary | ICD-10-CM | POA: Diagnosis not present

## 2022-01-13 DIAGNOSIS — M6281 Muscle weakness (generalized): Secondary | ICD-10-CM | POA: Diagnosis not present

## 2022-01-13 DIAGNOSIS — R300 Dysuria: Secondary | ICD-10-CM | POA: Diagnosis not present

## 2022-01-13 DIAGNOSIS — M62838 Other muscle spasm: Secondary | ICD-10-CM | POA: Diagnosis not present

## 2022-03-02 DIAGNOSIS — R001 Bradycardia, unspecified: Secondary | ICD-10-CM | POA: Diagnosis not present

## 2022-03-02 DIAGNOSIS — R079 Chest pain, unspecified: Secondary | ICD-10-CM | POA: Diagnosis not present

## 2022-04-02 DIAGNOSIS — R3912 Poor urinary stream: Secondary | ICD-10-CM | POA: Diagnosis not present

## 2022-04-02 DIAGNOSIS — R300 Dysuria: Secondary | ICD-10-CM | POA: Diagnosis not present

## 2022-04-02 DIAGNOSIS — N411 Chronic prostatitis: Secondary | ICD-10-CM | POA: Diagnosis not present

## 2022-04-02 DIAGNOSIS — N401 Enlarged prostate with lower urinary tract symptoms: Secondary | ICD-10-CM | POA: Diagnosis not present

## 2022-04-07 DIAGNOSIS — I10 Essential (primary) hypertension: Secondary | ICD-10-CM | POA: Diagnosis not present

## 2022-04-07 DIAGNOSIS — E782 Mixed hyperlipidemia: Secondary | ICD-10-CM | POA: Diagnosis not present

## 2022-04-07 DIAGNOSIS — Z0000 Encounter for general adult medical examination without abnormal findings: Secondary | ICD-10-CM | POA: Diagnosis not present

## 2022-05-28 DIAGNOSIS — M10061 Idiopathic gout, right knee: Secondary | ICD-10-CM | POA: Diagnosis not present

## 2022-06-05 DIAGNOSIS — Z03818 Encounter for observation for suspected exposure to other biological agents ruled out: Secondary | ICD-10-CM | POA: Diagnosis not present

## 2022-06-05 DIAGNOSIS — R519 Headache, unspecified: Secondary | ICD-10-CM | POA: Diagnosis not present

## 2022-06-05 DIAGNOSIS — M109 Gout, unspecified: Secondary | ICD-10-CM | POA: Diagnosis not present

## 2022-06-05 DIAGNOSIS — Z23 Encounter for immunization: Secondary | ICD-10-CM | POA: Diagnosis not present

## 2022-06-12 ENCOUNTER — Emergency Department (HOSPITAL_BASED_OUTPATIENT_CLINIC_OR_DEPARTMENT_OTHER): Payer: BC Managed Care – PPO | Admitting: Radiology

## 2022-06-12 ENCOUNTER — Other Ambulatory Visit (HOSPITAL_BASED_OUTPATIENT_CLINIC_OR_DEPARTMENT_OTHER): Payer: Self-pay

## 2022-06-12 ENCOUNTER — Other Ambulatory Visit: Payer: Self-pay

## 2022-06-12 ENCOUNTER — Encounter (HOSPITAL_BASED_OUTPATIENT_CLINIC_OR_DEPARTMENT_OTHER): Payer: Self-pay | Admitting: Emergency Medicine

## 2022-06-12 ENCOUNTER — Emergency Department (HOSPITAL_BASED_OUTPATIENT_CLINIC_OR_DEPARTMENT_OTHER)
Admission: EM | Admit: 2022-06-12 | Discharge: 2022-06-12 | Disposition: A | Discharge status: Home / Self Care | Payer: BC Managed Care – PPO | Attending: Emergency Medicine | Admitting: Emergency Medicine

## 2022-06-12 DIAGNOSIS — I10 Essential (primary) hypertension: Secondary | ICD-10-CM | POA: Diagnosis not present

## 2022-06-12 DIAGNOSIS — Z79899 Other long term (current) drug therapy: Secondary | ICD-10-CM | POA: Diagnosis not present

## 2022-06-12 DIAGNOSIS — Z7982 Long term (current) use of aspirin: Secondary | ICD-10-CM | POA: Diagnosis not present

## 2022-06-12 DIAGNOSIS — M25561 Pain in right knee: Secondary | ICD-10-CM

## 2022-06-12 DIAGNOSIS — M7051 Other bursitis of knee, right knee: Secondary | ICD-10-CM

## 2022-06-12 DIAGNOSIS — M65251 Calcific tendinitis, right thigh: Secondary | ICD-10-CM | POA: Insufficient documentation

## 2022-06-12 HISTORY — DX: Gout, unspecified: M10.9

## 2022-06-12 MED ORDER — PREDNISONE 10 MG PO TABS
40.0000 mg | ORAL_TABLET | Freq: Every day | ORAL | 0 refills | Status: AC
  Filled 2022-06-12: qty 16, 4d supply, fill #0

## 2022-06-12 MED ORDER — LIDOCAINE 5 % EX PTCH
1.0000 | MEDICATED_PATCH | CUTANEOUS | 0 refills | Status: AC
  Filled 2022-06-12: qty 30, 30d supply, fill #0

## 2022-06-12 MED ORDER — ACETAMINOPHEN 500 MG PO TABS
1000.0000 mg | ORAL_TABLET | Freq: Once | ORAL | Status: AC
Start: 2022-06-12 — End: 2022-06-12
  Administered 2022-06-12: 1000 mg via ORAL
  Filled 2022-06-12: qty 2

## 2022-06-12 MED ORDER — OXYCODONE HCL 5 MG PO TABS
5.0000 mg | ORAL_TABLET | ORAL | 0 refills | Status: DC | PRN
  Filled 2022-06-12: qty 10, 2d supply, fill #0

## 2022-06-12 MED ORDER — COLCHICINE 0.6 MG PO TABS
ORAL_TABLET | ORAL | 0 refills | Status: AC
  Filled 2022-06-12: qty 3, 1d supply, fill #0

## 2022-06-12 NOTE — ED Triage Notes (Signed)
Patient arrives POV in wheelchair c/o right knee pain x a couple weeks. Unsure of any injury but could have been triggered after running at the grocery store. Reports hx of gout. States he saw his doc and was given meds for gout that helped but pain returned.

## 2022-06-30 ENCOUNTER — Other Ambulatory Visit (HOSPITAL_COMMUNITY): Payer: Self-pay

## 2022-07-19 DIAGNOSIS — M109 Gout, unspecified: Secondary | ICD-10-CM | POA: Diagnosis not present

## 2022-08-11 DIAGNOSIS — Z23 Encounter for immunization: Secondary | ICD-10-CM | POA: Diagnosis not present

## 2022-12-29 DIAGNOSIS — N4889 Other specified disorders of penis: Secondary | ICD-10-CM | POA: Diagnosis not present

## 2022-12-29 DIAGNOSIS — R350 Frequency of micturition: Secondary | ICD-10-CM | POA: Diagnosis not present

## 2023-02-01 ENCOUNTER — Encounter (HOSPITAL_BASED_OUTPATIENT_CLINIC_OR_DEPARTMENT_OTHER): Payer: Self-pay

## 2023-02-01 ENCOUNTER — Other Ambulatory Visit: Payer: Self-pay

## 2023-02-01 ENCOUNTER — Emergency Department (HOSPITAL_BASED_OUTPATIENT_CLINIC_OR_DEPARTMENT_OTHER)
Admission: EM | Admit: 2023-02-01 | Discharge: 2023-02-01 | Disposition: A | Discharge status: Home / Self Care | Payer: BC Managed Care – PPO | Attending: Emergency Medicine | Admitting: Emergency Medicine

## 2023-02-01 DIAGNOSIS — Z79899 Other long term (current) drug therapy: Secondary | ICD-10-CM | POA: Insufficient documentation

## 2023-02-01 DIAGNOSIS — U071 COVID-19: Secondary | ICD-10-CM | POA: Diagnosis not present

## 2023-02-01 DIAGNOSIS — J019 Acute sinusitis, unspecified: Secondary | ICD-10-CM | POA: Diagnosis not present

## 2023-02-01 DIAGNOSIS — Z7982 Long term (current) use of aspirin: Secondary | ICD-10-CM | POA: Insufficient documentation

## 2023-02-01 DIAGNOSIS — I10 Essential (primary) hypertension: Secondary | ICD-10-CM | POA: Insufficient documentation

## 2023-02-01 DIAGNOSIS — R059 Cough, unspecified: Secondary | ICD-10-CM | POA: Diagnosis not present

## 2023-02-01 LAB — RESP PANEL BY RT-PCR (RSV, FLU A&B, COVID)  RVPGX2
Influenza A by PCR: NEGATIVE
Influenza B by PCR: NEGATIVE
Resp Syncytial Virus by PCR: NEGATIVE
SARS Coronavirus 2 by RT PCR: POSITIVE — AB

## 2023-02-01 MED ORDER — PSEUDOEPHEDRINE HCL 30 MG PO TABS
60.0000 mg | ORAL_TABLET | Freq: Once | ORAL | Status: AC
  Administered 2023-02-01: 60 mg via ORAL
  Filled 2023-02-01: qty 2

## 2023-02-01 MED ORDER — DOXYCYCLINE HYCLATE 100 MG PO CAPS: 100.0000 mg | ORAL_CAPSULE | Freq: Two times a day (BID) | ORAL | 0 refills | Status: DC

## 2023-02-01 MED ORDER — DOXYCYCLINE HYCLATE 100 MG PO TABS
100.0000 mg | ORAL_TABLET | Freq: Once | ORAL | Status: AC
  Administered 2023-02-01: 100 mg via ORAL
  Filled 2023-02-01: qty 1

## 2023-02-01 MED ORDER — PSEUDOEPHEDRINE HCL ER 120 MG PO TB12: 120.0000 mg | ORAL_TABLET | Freq: Two times a day (BID) | ORAL | 0 refills | Status: DC | PRN

## 2023-02-01 NOTE — ED Triage Notes (Signed)
Pt states that he has ahd sinus drainage/congestion x 10days, but today he has developed a sore throat  and productive cough.

## 2023-02-01 NOTE — ED Provider Notes (Signed)
[facility] Provider Note: [name], MD, FACEP  CSN: [id number] MRN: [id number] ARRIVAL: 02/01/23 at [place]: [place]  Cough   HISTORY OF PRESENT ILLNESS  02/01/23 2:58 AM Kenneth Logan is a 57 y.o. male with about 10 days of sinus drainage and nasal congestion.  Yesterday he developed a sore throat and a productive cough.  He rates the pain in his throat as a 7 out of 10.  He has been using Benadryl but no nasal decongestants.   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 REC.   Left shoulder pain    pt having pain and stiffness    Low back pain    S/P EPI INJECTION   Urinary frequency     Past Surgical History:  Procedure Laterality Date   HERNIA REPAIR  1998   left inguinal hernia    LIPOMA EXCISION  11/20/2011   Procedure: EXCISION LIPOMA;  Surgeon: Willey Blade, MD;  Location: [facility];  Service: General;  Laterality: Left;   REDUCTION OF TORSION OF TESTIS  20 YRS AGO   LEFT    Family History  Problem Relation Age of Onset   Cancer Father        liver    Social History   Tobacco Use   Smoking status: Never   Smokeless tobacco: Never  Substance Use Topics   Alcohol use: Yes    Comment: six beers per week or more   Drug use: No    Prior to Admission medications   Medication Sig Start Date End Date Taking? Authorizing Provider  doxycycline (VIBRAMYCIN) 100 MG capsule Take 1 capsule (100 mg total) by mouth 2 (two) times daily. One po bid x 7 days 02/01/23  Yes Allea Kassner, MD  pseudoephedrine (SUDAFED) 120 MG 12 hr tablet Take 1 tablet (120 mg total) by mouth every 12 (twelve) hours as needed for congestion. 02/01/23  Yes Andora Krull, MD  allopurinol (ZYLOPRIM) 100 MG tablet  Take 200 mg by mouth daily.    [provider]  amLODipine (NORVASC) 5 MG tablet Take 5 mg by mouth daily. 04/07/22   [provider]  aspirin EC 81 MG tablet Take 81 mg by mouth daily.    [provider]  atorvastatin (LIPITOR) 40 MG tablet Take 40 mg by mouth daily. 05/11/22   [provider]  cholecalciferol (VITAMIN D) 1000 UNITS tablet Take 1,000 Units by mouth daily.      [provider]  colchicine 0.6 MG tablet Take 2 tablets and one hour later take one tablet. 06/12/22   Gareth Morgan, MD  diphenhydrAMINE (BENADRYL) 2 % cream Apply 1 application topically 2 (two) times daily as needed. For rash    [provider]  diphenhydrAMINE (BENADRYL) 25 MG tablet Take 1 tablet (25 mg total) by mouth every 6 (six) hours. 06/06/12 07/06/12  Kirichenko, Lahoma Rocker, PA-C  indomethacin (INDOCIN) 25 MG capsule Take by mouth. 05/28/22   [provider]  lidocaine (LIDODERM) 5 % Place 1 patch onto the skin daily. Remove & Discard patch within 12 hours or as directed by MD 06/12/22   Gareth Morgan, MD  Multiple  Vitamin (MULTIVITAMIN) tablet Take 1 tablet by mouth daily.      [provider]  olmesartan-hydrochlorothiazide (BENICAR HCT) 40-25 MG tablet Take 1 tablet by mouth every morning. 04/07/22   [provider]  oxyCODONE (ROXICODONE) 5 MG immediate release tablet Take 1 tablet (5 mg total) by mouth every 4 (four) hours as needed for severe pain. 06/12/22   Gareth Morgan, MD  simvastatin (ZOCOR) 40 MG tablet Take 40 mg by mouth daily.    [provider]  valsartan-hydrochlorothiazide (DIOVAN-HCT) 160-25 MG per tablet Take 1 tablet by mouth daily.    [provider]    Allergies Patient has no known allergies.   REVIEW OF SYSTEMS  Negative except as noted here or in the History of Present Illness.   PHYSICAL EXAMINATION  Initial Vital Signs Blood pressure (!) 132/94, pulse 71, temperature 98.2 F (36.8  C), resp. rate 18, height 6' (1.829 m), weight 92.5 kg, SpO2 98 %.  Examination General: Well-developed, well-nourished male in no acute distress; appearance consistent with age of record HENT: normocephalic; atraumatic; no pharyngeal erythema or exudate Eyes: Normal appearance Neck: supple Heart: regular rate and rhythm Lungs: clear to auscultation bilaterally Abdomen: soft; nondistended; nontender; bowel sounds present Extremities: No deformity; full range of motion; pulses normal Neurologic: Awake, alert and oriented; motor function intact in all extremities and symmetric; no facial droop Skin: Warm and dry Psychiatric: Normal mood and affect   RESULTS  Summary of this visit's results, reviewed and interpreted by myself:   EKG Interpretation  Date/Time:    Ventricular Rate:    PR Interval:    QRS Duration:   QT Interval:    QTC Calculation:   R Axis:     Text Interpretation:         Laboratory Studies: Results for orders placed or performed during the hospital encounter of 02/01/23 (from the past 24 hour(s))  Resp panel by RT-PCR (RSV, Flu A&B, Covid) Nasopharyngeal Swab     Status: Abnormal   Collection Time: 02/01/23  2:02 AM   Specimen: Nasopharyngeal Swab; Nasal Swab  Result Value Ref Range   SARS Coronavirus 2 by RT PCR POSITIVE (A) NEGATIVE   Influenza A by PCR NEGATIVE NEGATIVE   Influenza B by PCR NEGATIVE NEGATIVE   Resp Syncytial Virus by PCR NEGATIVE NEGATIVE   Imaging Studies: No results found.  ED COURSE and MDM  Nursing notes, initial and subsequent vitals signs, including pulse oximetry, reviewed and interpreted by myself.  Vitals:   02/01/23 0201 02/01/23 0203  BP: (!) 132/94   Pulse: 71   Resp: 18   Temp: 98.2 F (36.8 C)   SpO2: 98%   Weight:  92.5 kg  Height:  6' (1.829 m)   Medications  doxycycline (VIBRA-TABS) tablet 100 mg (has no administration in time range)  pseudoephedrine (SUDAFED) tablet 60 mg (has no administration in  time range)    This patient states his most frustrating symptom is the sinus pressure and congestion.  I suspect this is a sinusitis that has been going on for some time and that the COVID is acute given his new onset symptoms.  We will treat him for acute sinusitis and he was advised to use over-the-counter Sudafed or other sinus medication.  PROCEDURES  Procedures   ED DIAGNOSES     ICD-10-CM   1. COVID-19 virus infection  U07.1     2. Acute non-recurrent sinusitis, unspecified location  J01.90  Maryanna Stuber, Jenny Reichmann, MD 02/01/23 [phone]

## 2023-03-04 ENCOUNTER — Encounter: Payer: Self-pay | Admitting: Physical Medicine and Rehabilitation

## 2023-03-04 ENCOUNTER — Ambulatory Visit (INDEPENDENT_AMBULATORY_CARE_PROVIDER_SITE_OTHER): Payer: BC Managed Care – PPO | Admitting: Physical Medicine and Rehabilitation

## 2023-03-04 ENCOUNTER — Ambulatory Visit (INDEPENDENT_AMBULATORY_CARE_PROVIDER_SITE_OTHER): Payer: BC Managed Care – PPO

## 2023-03-04 DIAGNOSIS — M5442 Lumbago with sciatica, left side: Secondary | ICD-10-CM | POA: Diagnosis not present

## 2023-03-04 DIAGNOSIS — M5416 Radiculopathy, lumbar region: Secondary | ICD-10-CM | POA: Diagnosis not present

## 2023-03-04 DIAGNOSIS — M5136 Other intervertebral disc degeneration, lumbar region: Secondary | ICD-10-CM

## 2023-03-04 DIAGNOSIS — M4125 Other idiopathic scoliosis, thoracolumbar region: Secondary | ICD-10-CM | POA: Diagnosis not present

## 2023-03-04 DIAGNOSIS — G8929 Other chronic pain: Secondary | ICD-10-CM | POA: Diagnosis not present

## 2023-03-04 DIAGNOSIS — M5137 Other intervertebral disc degeneration, lumbosacral region: Secondary | ICD-10-CM

## 2023-03-04 DIAGNOSIS — M4135 Thoracogenic scoliosis, thoracolumbar region: Secondary | ICD-10-CM

## 2023-03-04 MED ORDER — PREDNISONE 50 MG PO TABS: 50.0000 mg | ORAL_TABLET | Freq: Every day | ORAL | 0 refills | Status: DC

## 2023-03-04 MED ORDER — MELOXICAM 15 MG PO TABS: 15.0000 mg | ORAL_TABLET | Freq: Every day | ORAL | 0 refills | Status: DC

## 2023-03-04 NOTE — Progress Notes (Signed)
Functional Pain Scale - descriptive words and definitions  Distracting (5)    Aware of pain/able to complete some ADL's but limited by pain/sleep is affected and active distractions are only slightly useful. Moderate range order  Average Pain  varies  Lower back pain on left side that radiates in the left hip and down the front of the leg. Standing and walking makes pain worse

## 2023-03-04 NOTE — Progress Notes (Addendum)
Kenneth Logan - 57 y.o. male MRN [id number]  Date of birth: 1966/12/05  Office Visit Note: Visit Date: 03/04/2023 PCP: Irven Coe, MD Referred by: Irven Coe, MD  Subjective: Chief Complaint  Patient presents with   Lower Back - Pain   HPI: Theodis Kinsel is a 57 y.o. male who comes in today as a self referral for evaluation of chronic, worsening and severe left sided lower back pain radiating to left lateral hip and down anterolateral leg to foot. Pain ongoing for several years, worsens with standing and walking. He describes pain as sore and sharp sensation, currently rates as 8 out of 10. Some relief of pain with home exercise regimen, rest and use of medications. History of formal physical therapy several years ago, some relief of pain with these treatments. No previous lumbar MRI imaging. Lumbar x-rays from 2011 exhibit scoliosis and mild spondylosis, worse at L4-L5. History of lumbar epidural steroid injections with Dr. Sheran Luz at [facility] about 10 years ago. Reports some relief of pain with these procedures. Patient denies focal weakness, numbness and tingling. No recent trauma or falls.    Oswestry Disability Index Score 32% 10 to 20 (40%) moderate disability: The patient experiences more pain and difficulty with sitting, lifting and standing. Travel and social life are more difficult, and they may be disabled from work. Personal care, sexual activity and sleeping are not grossly affected, and the patient can usually be managed by conservative means.  Review of Systems  Musculoskeletal:  Positive for back pain.  Neurological:  Negative for tingling, sensory change, focal weakness and weakness.  All other systems reviewed and are negative.  Otherwise per HPI.  Assessment & Plan: Visit Diagnoses:    ICD-10-CM   1. Chronic left-sided low back pain with left-sided sciatica  M54.42    G89.29     2. Lumbar radiculopathy  M54.16 XR Lumbar Spine Complete    MR LUMBAR  SPINE WO CONTRAST    3. Other idiopathic scoliosis, thoracolumbar region  M41.25        Plan: Findings:  Chronic, worsening and severe left sided lower back pain radiating to left lateral hip and down anterolateral leg to foot. Patient continues to have severe pain despite good conservative therapies such as formal physical therapy, home exercise regimen, rest and use of medications. I did obtain 4 view lumbar x-rays in the office today that exhibits thoracolumbar levoscoliosis, multi level degenerative changes, most severe at L4-L5 and L5-S1. No listhesis noted.  Patients clinical presentation and exam are consistent with L5 nerve pattern. Next step is to obtain  lumbar MRI imaging. Depending on results of MRI imaging we would consider performing lumbar epidural steroid injection. We did calculate cobb angles based on imaging today, 30 degrees on new imaging compared to 15 degrees on imaging from 2011 upon our calculations. Levoscoliosis has significantly increased over the last 13  years. Would consider referral to Dr. Willia Craze to discuss surgical options. I did briefly consult with Dr. Christell Constant today, he is happy to see him, feels patient would require a complex surgery that does carry 30% risk rate. I discussed medication management with patient today and did prescribe Mobic and short course of Prednisone. We will see him back for MRI review and to discuss treatment options. No red flag symptoms noted.     Meds & Orders:  Meds ordered this encounter  Medications   predniSONE (DELTASONE) 50 MG tablet    Sig: Take 1 tablet (50 mg total)  by mouth daily with breakfast. Take until completed.    Dispense:  5 tablet    Refill:  0   meloxicam (MOBIC) 15 MG tablet    Sig: Take 1 tablet (15 mg total) by mouth daily.    Dispense:  30 tablet    Refill:  0    Orders Placed This Encounter  Procedures   XR Lumbar Spine Complete   MR LUMBAR SPINE WO CONTRAST    Follow-up: Return for follow up for  lumbar MRI review.   Procedures: No procedures performed      Clinical History: CLINICAL DATA:  Low back pain radiating into the left lower extremity for 1 month. No known injury or prior relevant surgery.   EXAM: MRI LUMBAR SPINE WITHOUT CONTRAST   TECHNIQUE: Multiplanar, multisequence MR imaging of the lumbar spine was performed. No intravenous contrast was administered.   COMPARISON:  Radiographs 03/04/2023.   FINDINGS: Segmentation: Conventional anatomy assumed, with the last open disc space designated L5-S1.Concordant with prior radiographs.   Alignment: Convex left scoliosis centered at L2-3, measuring 27 degrees. There is leftward listhesis of L4 relative to L5. The lateral alignment is normal.   Vertebrae: No worrisome osseous lesion, acute fracture or pars defect. Chronic endplate degenerative changes at L4-5. The visualized sacroiliac joints appear unremarkable.   Conus medullaris: Extends to the L1 level and appears normal.   Paraspinal and other soft tissues: No significant paraspinal findings. 2.8 cm cyst in the lower pole of the left kidney for which no follow-up imaging is recommended. 2.1 cm right adrenal nodule (image 2/17) is incompletely characterized, although likely an incidental adrenal adenoma.   Disc levels:   Sagittal images through the lower thoracic spine demonstrate mild disc bulging and asymmetric facet hypertrophy on the left at T10-11 and T11-12. No cord deformity. There is mild left-sided foraminal narrowing.   T12-L1: No significant findings.   L1-2: Disc height and hydration are maintained. Mild bilateral facet hypertrophy. No spinal stenosis or nerve root encroachment.   L2-3: Mild loss of disc height with mild disc bulging and endplate osteophytes asymmetric to the right. Mild bilateral facet hypertrophy. Mild to moderate right lateral recess and right foraminal narrowing. The spinal canal and left foramen are patent.   L3-4:  Preserved disc height with mild disc bulging, facet and ligamentous hypertrophy. Mild right lateral recess narrowing. The spinal canal and both foramina are widely patent.   L4-5: Chronic loss of disc height with annular disc bulging and endplate osteophytes. As above, there is leftward listhesis of L4 relative to L5. Moderate facet and ligamentous hypertrophy. These factors contribute to mild to moderate multifactorial spinal stenosis with moderate narrowing of both lateral recesses, severe right and moderate left foraminal narrowing.   L5-S1: Relatively preserved disc height with annular disc bulging and endplate osteophytes asymmetric to the left. There is asymmetric left-sided facet hypertrophy. These factors contribute to severe left foraminal narrowing and probable left L5 nerve root encroachment. There is mild asymmetric narrowing of the left lateral recess. The spinal canal, right lateral recess and right foramen are patent.   IMPRESSION: 1. Convex left scoliosis with leftward listhesis of L4 relative to L5. No acute osseous findings. 2. Mild to moderate right lateral recess and right foraminal narrowing at L2-3. 3. Mild to moderate multifactorial spinal stenosis at L4-5 with moderate narrowing of both lateral recesses, severe right and moderate left foraminal narrowing. 4. Severe left foraminal narrowing at L5-S1 with probable left L5 nerve root encroachment. 5. Probable  incidental right adrenal adenoma. Recommend more definitive characterization with adrenal washout CT or chemical shift MRI. JACR 2017 Aug; 14(8):1038-44, JCAT 2016 Mar-Apr; 40(2):194-200, Urol J 2006 Spring; 3(2):71-4.     Electronically Signed   By: Carey Bullocks M.D.   On: 04/02/2023 16:17   He reports that he has never smoked. He has never used smokeless tobacco. No results for input(s): "HGBA1C", "LABURIC" in the last 8760 hours.  Objective:  VS:  HT:    WT:   BMI:     BP:   HR: bpm  TEMP:  ( )  RESP:  Physical Exam Vitals and nursing note reviewed.  HENT:     Head: Normocephalic and atraumatic.     Right Ear: External ear normal.     Left Ear: External ear normal.     Nose: Nose normal.     Mouth/Throat:     Mouth: Mucous membranes are moist.  Eyes:     Extraocular Movements: Extraocular movements intact.  Cardiovascular:     Rate and Rhythm: Normal rate.     Pulses: Normal pulses.  Pulmonary:     Effort: Pulmonary effort is normal.  Abdominal:     General: Abdomen is flat. There is no distension.  Musculoskeletal:        General: Tenderness present.     Cervical back: Normal range of motion.     Comments: Patient rises from seated position to standing without difficulty. Good lumbar range of motion. No pain noted with facet loading. 5/5 strength noted with bilateral hip flexion, knee flexion/extension, ankle dorsiflexion/plantarflexion and EHL. No clonus noted bilaterally. No pain upon palpation of greater trochanters. No pain with internal/external rotation of bilateral hips. Sensation intact bilaterally. Dysesthesias noted to left L5 dermatome. Ambulates without aid, gait steady.     Skin:    General: Skin is warm and dry.     Capillary Refill: Capillary refill takes less than 2 seconds.  Neurological:     General: No focal deficit present.     Mental Status: He is alert and oriented to person, place, and time.  Psychiatric:        Mood and Affect: Mood normal.        Behavior: Behavior normal.     Ortho Exam  Imaging: No results found.  Past Medical/Family/Surgical/Social History: Medications & Allergies reviewed per EMR, new medications updated. There are no problems to display for this patient.  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 REC.   Left shoulder pain    pt having pain and stiffness    Low back pain    S/P EPI INJECTION   Urinary frequency    Family History  Problem Relation Age of Onset   Cancer Father        liver   Past Surgical History:  Procedure Laterality Date   HERNIA REPAIR  1998   left inguinal hernia    LIPOMA EXCISION  11/20/2011   Procedure: EXCISION LIPOMA;  Surgeon: Iona Coach, MD;  Location: [facility];  Service: General;  Laterality: Left;   REDUCTION OF TORSION OF TESTIS  20 YRS AGO   LEFT   Social History  Occupational History   Not on file  Tobacco Use   Smoking status: Never   Smokeless tobacco: Never  Substance and Sexual Activity   Alcohol use: Yes    Comment: six beers per week or more   Drug use: No   Sexual activity: Not on file

## 2023-03-31 ENCOUNTER — Ambulatory Visit
Admission: RE | Admit: 2023-03-31 | Discharge: 2023-03-31 | Disposition: A | Discharge status: Home / Self Care | Payer: BC Managed Care – PPO | Source: Ambulatory Visit | Attending: Physical Medicine and Rehabilitation | Admitting: Physical Medicine and Rehabilitation

## 2023-03-31 DIAGNOSIS — M48061 Spinal stenosis, lumbar region without neurogenic claudication: Secondary | ICD-10-CM | POA: Diagnosis not present

## 2023-03-31 DIAGNOSIS — M5416 Radiculopathy, lumbar region: Secondary | ICD-10-CM

## 2023-03-31 DIAGNOSIS — M5117 Intervertebral disc disorders with radiculopathy, lumbosacral region: Secondary | ICD-10-CM | POA: Diagnosis not present

## 2023-04-02 ENCOUNTER — Telehealth: Payer: Self-pay | Admitting: Physical Medicine and Rehabilitation

## 2023-04-02 NOTE — Telephone Encounter (Signed)
Received call from patient. He would like a call back with his MRI results.he states he wasn't aware to schedule a followup appt to go over the results. Pts callback [phone]

## 2023-04-06 ENCOUNTER — Ambulatory Visit: Payer: BC Managed Care – PPO | Admitting: Physical Medicine and Rehabilitation

## 2023-04-06 ENCOUNTER — Other Ambulatory Visit (HOSPITAL_COMMUNITY): Payer: Self-pay | Admitting: Family Medicine

## 2023-04-06 ENCOUNTER — Encounter: Payer: Self-pay | Admitting: Physical Medicine and Rehabilitation

## 2023-04-06 DIAGNOSIS — M4125 Other idiopathic scoliosis, thoracolumbar region: Secondary | ICD-10-CM

## 2023-04-06 DIAGNOSIS — M5416 Radiculopathy, lumbar region: Secondary | ICD-10-CM | POA: Diagnosis not present

## 2023-04-06 DIAGNOSIS — M545 Low back pain, unspecified: Secondary | ICD-10-CM | POA: Diagnosis not present

## 2023-04-06 DIAGNOSIS — D3500 Benign neoplasm of unspecified adrenal gland: Secondary | ICD-10-CM | POA: Diagnosis not present

## 2023-04-06 DIAGNOSIS — M5442 Lumbago with sciatica, left side: Secondary | ICD-10-CM | POA: Diagnosis not present

## 2023-04-06 DIAGNOSIS — M9903 Segmental and somatic dysfunction of lumbar region: Secondary | ICD-10-CM | POA: Diagnosis not present

## 2023-04-06 DIAGNOSIS — M9905 Segmental and somatic dysfunction of pelvic region: Secondary | ICD-10-CM | POA: Diagnosis not present

## 2023-04-06 DIAGNOSIS — M5386 Other specified dorsopathies, lumbar region: Secondary | ICD-10-CM | POA: Diagnosis not present

## 2023-04-06 DIAGNOSIS — M9904 Segmental and somatic dysfunction of sacral region: Secondary | ICD-10-CM | POA: Diagnosis not present

## 2023-04-06 DIAGNOSIS — G8929 Other chronic pain: Secondary | ICD-10-CM | POA: Diagnosis not present

## 2023-04-06 MED ORDER — TRAMADOL HCL 50 MG PO TABS: 50.0000 mg | ORAL_TABLET | Freq: Three times a day (TID) | ORAL | 0 refills | Status: DC | PRN

## 2023-04-06 NOTE — Progress Notes (Unsigned)
Kenneth Logan - 57 y.o. male MRN [id number]  Date of birth: 04/07/1966  Office Visit Note: Visit Date: 04/06/2023 PCP: Irven Coe, MD Referred by: Irven Coe, MD  Subjective: Chief Complaint  Patient presents with   Other     Follow up to review MRI scan   HPI: Kenneth Logan is a 57 y.o. male who comes in today for evaluation of chronic, worsening and severe left sided lower back pain radiating to buttock, hip and down lateral leg to foot. Pain ongoing for several years, worsens with standing and walking. He describes pain as sore and sharp, currently rates as 10 out of 10. Some relief of pain with home exercise regimen, rest and use of medications. History of formal physical therapy in the past with some relief of pain. Recent lumbar MRI imaging exhibits levoscoliosis centered at L2-L3, measuring 27 degrees, mild to moderate multifactorial spinal stenosis at L4-L5 and severe left foraminal narrowing at L5-S1 with probable left L5 nerve root encroachment. No high grade spinal canal stenosis. There is incidental finding of probable right adrenal adenoma. Recommend more definitive characterization with adrenal washout CT or chemical shift MRI. History of lumbar epidural steroid injections with Dr. Sheran Luz at [facility] about 10 years ago. Reports some relief of pain with these procedures. States he works in [employer] and is unable to complete job duties due to severe discomfort. Patient verbalizes frustration today as he needs to be out of pain immediately. Patient denies focal weakness, numbness and tingling. No recent trauma or falls.    Review of Systems  Musculoskeletal:  Positive for back pain.  Neurological:  Negative for tingling, sensory change, focal weakness and weakness.  All other systems reviewed and are negative.  Otherwise per HPI.  Assessment & Plan: Visit Diagnoses:    ICD-10-CM   1. Lumbar radiculopathy  M54.16 Ambulatory referral to Physical  Medicine Rehab    2. Chronic left-sided low back pain with left-sided sciatica  M54.42 Ambulatory referral to Physical Medicine Rehab   G89.29     3. Other idiopathic scoliosis, thoracolumbar region  M41.25 Ambulatory referral to Physical Medicine Rehab       Plan: Findings:  Chronic, worsening and severe left-sided lower back pain radiating to buttock, hip and down lateral leg to foot. Patient continues to have severe pain despite good conservative therapies such as formal physical therapy, home exercise regimen, rest and use of medications. I discussed recent lumbar MRI with patient today using imaging and spine model. Will ensure his primary care Dr. Irven Coe with [facility] is aware of probable incidental findings of right adrenal adenoma. Radiologist recommended possible further advanced imaging. I did send staff message in EPIC today, will also ensure he gets our note. Patients clinical presentation and exam are consistent with L5 nerve pattern. Next step is to perform diagnostic and hopefully therapeutic left L5-S1 interlaminar epidural steroid injection under fluoroscopic guidance. He is not currently taking anticoagulant medication. If good relief of pain with injection we can repeat this procedure infrequently as needed. Injection procedure explained in detail today, he has no questions at this time. I discussed medication management with patient today and prescribed short course of Tramadol until we are able to get him in for injection. We will call to get patient scheduled for injection pending insurance approval. No red flag symptoms noted upon exam today.     Meds & Orders:  Meds ordered this encounter  Medications   traMADol (ULTRAM) 50 MG  tablet    Sig: Take 1 tablet (50 mg total) by mouth every 8 (eight) hours as needed.    Dispense:  15 tablet    Refill:  0    Orders Placed This Encounter  Procedures   Ambulatory referral to Physical Medicine Rehab    Follow-up:  Return for Left L5-S1 interlaminar epidural steroid injection.   Procedures: No procedures performed      Clinical History: CLINICAL DATA:  Low back pain radiating into the left lower extremity for 1 month. No known injury or prior relevant surgery.   EXAM: MRI LUMBAR SPINE WITHOUT CONTRAST   TECHNIQUE: Multiplanar, multisequence MR imaging of the lumbar spine was performed. No intravenous contrast was administered.   COMPARISON:  Radiographs 03/04/2023.   FINDINGS: Segmentation: Conventional anatomy assumed, with the last open disc space designated L5-S1.Concordant with prior radiographs.   Alignment: Convex left scoliosis centered at L2-3, measuring 27 degrees. There is leftward listhesis of L4 relative to L5. The lateral alignment is normal.   Vertebrae: No worrisome osseous lesion, acute fracture or pars defect. Chronic endplate degenerative changes at L4-5. The visualized sacroiliac joints appear unremarkable.   Conus medullaris: Extends to the L1 level and appears normal.   Paraspinal and other soft tissues: No significant paraspinal findings. 2.8 cm cyst in the lower pole of the left kidney for which no follow-up imaging is recommended. 2.1 cm right adrenal nodule (image 2/17) is incompletely characterized, although likely an incidental adrenal adenoma.   Disc levels:   Sagittal images through the lower thoracic spine demonstrate mild disc bulging and asymmetric facet hypertrophy on the left at T10-11 and T11-12. No cord deformity. There is mild left-sided foraminal narrowing.   T12-L1: No significant findings.   L1-2: Disc height and hydration are maintained. Mild bilateral facet hypertrophy. No spinal stenosis or nerve root encroachment.   L2-3: Mild loss of disc height with mild disc bulging and endplate osteophytes asymmetric to the right. Mild bilateral facet hypertrophy. Mild to moderate right lateral recess and right foraminal narrowing. The spinal  canal and left foramen are patent.   L3-4: Preserved disc height with mild disc bulging, facet and ligamentous hypertrophy. Mild right lateral recess narrowing. The spinal canal and both foramina are widely patent.   L4-5: Chronic loss of disc height with annular disc bulging and endplate osteophytes. As above, there is leftward listhesis of L4 relative to L5. Moderate facet and ligamentous hypertrophy. These factors contribute to mild to moderate multifactorial spinal stenosis with moderate narrowing of both lateral recesses, severe right and moderate left foraminal narrowing.   L5-S1: Relatively preserved disc height with annular disc bulging and endplate osteophytes asymmetric to the left. There is asymmetric left-sided facet hypertrophy. These factors contribute to severe left foraminal narrowing and probable left L5 nerve root encroachment. There is mild asymmetric narrowing of the left lateral recess. The spinal canal, right lateral recess and right foramen are patent.   IMPRESSION: 1. Convex left scoliosis with leftward listhesis of L4 relative to L5. No acute osseous findings. 2. Mild to moderate right lateral recess and right foraminal narrowing at L2-3. 3. Mild to moderate multifactorial spinal stenosis at L4-5 with moderate narrowing of both lateral recesses, severe right and moderate left foraminal narrowing. 4. Severe left foraminal narrowing at L5-S1 with probable left L5 nerve root encroachment. 5. Probable incidental right adrenal adenoma. Recommend more definitive characterization with adrenal washout CT or chemical shift MRI. JACR 2017 Aug; 14(8):1038-44, JCAT 2016 Mar-Apr; 40(2):194-200, Urol J 2006  Spring; 3(2):71-4.     Electronically Signed   By: Carey Bullocks M.D.   On: 04/02/2023 16:17   He reports that he has never smoked. He has never used smokeless tobacco. No results for input(s): "HGBA1C", "LABURIC" in the last 8760 hours.  Objective:  VS:   HT:    WT:   BMI:     BP:   HR: bpm  TEMP: ( )  RESP:  Physical Exam Vitals and nursing note reviewed.  HENT:     Head: Normocephalic and atraumatic.     Right Ear: External ear normal.     Left Ear: External ear normal.     Nose: Nose normal.     Mouth/Throat:     Mouth: Mucous membranes are moist.  Eyes:     Extraocular Movements: Extraocular movements intact.  Cardiovascular:     Rate and Rhythm: Normal rate.     Pulses: Normal pulses.  Pulmonary:     Effort: Pulmonary effort is normal.  Abdominal:     General: Abdomen is flat. There is no distension.  Musculoskeletal:        General: Tenderness present.     Cervical back: Normal range of motion.     Comments: Patient rises from seated position to standing without difficulty. Good lumbar range of motion. No pain noted with facet loading. 5/5 strength noted with bilateral hip flexion, knee flexion/extension, ankle dorsiflexion/plantarflexion and EHL. No clonus noted bilaterally. No pain upon palpation of greater trochanters. No pain with internal/external rotation of bilateral hips. Sensation intact bilaterally. Dysesthesias noted to left L5 dermatome. Negative slump test bilaterally. Ambulates without aid, gait steady.     Skin:    General: Skin is warm and dry.     Capillary Refill: Capillary refill takes less than 2 seconds.  Neurological:     General: No focal deficit present.     Mental Status: He is alert and oriented to person, place, and time.  Psychiatric:        Mood and Affect: Mood normal.        Behavior: Behavior normal.     Ortho Exam  Imaging: No results found.  Past Medical/Family/Surgical/Social History: Medications & Allergies reviewed per EMR, new medications updated. There are no problems to display for this patient.  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 REC.   Left shoulder pain    pt having pain and stiffness    Low back pain    S/P EPI INJECTION   Urinary frequency    Family History  Problem Relation Age of Onset   Cancer Father        liver   Past Surgical History:  Procedure Laterality Date   HERNIA REPAIR  1998   left inguinal hernia    LIPOMA EXCISION  11/20/2011   Procedure: EXCISION LIPOMA;  Surgeon: Iona Coach, MD;  Location: [facility];  Service: General;  Laterality: Left;   REDUCTION OF TORSION OF TESTIS  20 YRS AGO   LEFT   Social History   Occupational History   Not on file  Tobacco Use   Smoking status: Never   Smokeless tobacco: Never  Substance  and Sexual Activity   Alcohol use: Yes    Comment: six beers per week or more   Drug use: No   Sexual activity: Not on file

## 2023-04-06 NOTE — Progress Notes (Unsigned)
Functional Pain Scale - descriptive words and definitions  Unmanageable (7)  Pain interferes with normal ADL's/nothing seems to help/sleep is very difficult/active distractions are very difficult to concentrate on. Severe range order  Average Pain 7

## 2023-04-07 DIAGNOSIS — M9903 Segmental and somatic dysfunction of lumbar region: Secondary | ICD-10-CM | POA: Diagnosis not present

## 2023-04-07 DIAGNOSIS — M9904 Segmental and somatic dysfunction of sacral region: Secondary | ICD-10-CM | POA: Diagnosis not present

## 2023-04-07 DIAGNOSIS — M9905 Segmental and somatic dysfunction of pelvic region: Secondary | ICD-10-CM | POA: Diagnosis not present

## 2023-04-07 DIAGNOSIS — M5386 Other specified dorsopathies, lumbar region: Secondary | ICD-10-CM | POA: Diagnosis not present

## 2023-04-08 ENCOUNTER — Telehealth: Payer: Self-pay | Admitting: Physical Medicine and Rehabilitation

## 2023-04-08 NOTE — Telephone Encounter (Signed)
Thank you! I'll call patient.  He requested medical records also.

## 2023-04-08 NOTE — Telephone Encounter (Signed)
CD is ready. Is this one of yours or should I put it up front and call patient?

## 2023-04-08 NOTE — Telephone Encounter (Signed)
Please copy xrays from 03/04/23 to CD. Please advise when ready. Thank you!!

## 2023-04-15 ENCOUNTER — Encounter: Payer: Self-pay | Admitting: Family

## 2023-04-15 ENCOUNTER — Other Ambulatory Visit: Payer: Self-pay | Admitting: Physical Medicine and Rehabilitation

## 2023-04-15 ENCOUNTER — Ambulatory Visit: Payer: BC Managed Care – PPO | Admitting: Family

## 2023-04-15 ENCOUNTER — Telehealth: Payer: Self-pay | Admitting: Physical Medicine and Rehabilitation

## 2023-04-15 DIAGNOSIS — M2141 Flat foot [pes planus] (acquired), right foot: Secondary | ICD-10-CM

## 2023-04-15 DIAGNOSIS — M2142 Flat foot [pes planus] (acquired), left foot: Secondary | ICD-10-CM

## 2023-04-15 DIAGNOSIS — M5416 Radiculopathy, lumbar region: Secondary | ICD-10-CM

## 2023-04-15 MED ORDER — TRAMADOL HCL 50 MG PO TABS: 50.0000 mg | ORAL_TABLET | Freq: Three times a day (TID) | ORAL | 0 refills | Status: AC | PRN

## 2023-04-15 NOTE — Progress Notes (Signed)
Office Visit Note   Patient: Kenneth Logan           Date of Birth: 1966/11/20           MRN: [id number] Visit Date: 04/15/2023              Requested by: Irven Coe, MD [address] [place],  [place] [address] PCP: Irven Coe, MD  Chief Complaint  Patient presents with   Right Foot - Flat Foot   Left Foot - Follow-up, Flat Foot      HPI: The patient is a 57 year old gentleman who is seen today for evaluation for possible new orthotics.  He reports that history of flatfeet for decades now.  He has previously had some orthotics fabricated which were quite stiff and hard.  Reports these were uncomfortable and did not actually wear them much.  He does stand on his feet for work for long hours daily.  He is currently awaiting an epidural steroid injection does have spinal stenosis and current lumbar radiculopathy on the left lower extremity.  He is wondering if the orthotics will be useful in preventing further flareups of his back pain.  Assessment & Plan: Visit Diagnoses: No diagnosis found.  Plan: Offered prescription for orthotics.  The patient has opted to proceed with his epidural steroid injection and we will see how he does.  Discussed supportive shoe wear.  Offered a prescription for custom orthotics in the future should he prefer 1.  Recommended: Strengthening exercises.  Follow-Up Instructions: No follow-ups on file.   Back Exam   Tenderness  The patient is experiencing no tenderness.   Muscle Strength  The patient has normal back strength.  Tests  Straight leg raise left: positive  Other  Gait: normal       Patient is alert, oriented, no adenopathy, well-dressed, normal affect, normal respiratory effort. Pes planus bilaterally.  Imaging: No results found. No images are attached to the encounter.  Labs: No results found for: "HGBA1C", "ESRSEDRATE", "CRP", "LABURIC", "REPTSTATUS", "GRAMSTAIN", "CULT", "LABORGA"   No results found for:  "ALBUMIN", "PREALBUMIN", "CBC"  No results found for: "MG" No results found for: "VD25OH"  No results found for: "PREALBUMIN"    Latest Ref Rng & Units 08/19/2014    5:20 PM 11/20/2011   10:12 AM  CBC EXTENDED  WBC 4.0 - 10.5 K/uL 5.0  5.0   RBC 4.22 - 5.81 MIL/uL 4.31  4.21   Hemoglobin 13.0 - 17.0 g/dL 19.1  47.8   HCT 29.5 - 52.0 % 38.5  39.3   Platelets 150 - 400 K/uL 303  245   NEUT# 1.7 - 7.7 K/uL  2.6   Lymph# 0.7 - 4.0 K/uL  1.8      There is no height or weight on file to calculate BMI.  Orders:  No orders of the defined types were placed in this encounter.  No orders of the defined types were placed in this encounter.    Procedures: No procedures performed  Clinical Data: No additional findings.  ROS:  All other systems negative, except as noted in the HPI. Review of Systems  Constitutional: Negative.   Musculoskeletal:  Positive for back pain.  Neurological:  Negative for weakness.    Objective: Vital Signs: There were no vitals taken for this visit.  Specialty Comments:  CLINICAL DATA:  Low back pain radiating into the left lower extremity for 1 month. No known injury or prior relevant surgery.   EXAM: MRI  LUMBAR SPINE WITHOUT CONTRAST   TECHNIQUE: Multiplanar, multisequence MR imaging of the lumbar spine was performed. No intravenous contrast was administered.   COMPARISON:  Radiographs 03/04/2023.   FINDINGS: Segmentation: Conventional anatomy assumed, with the last open disc space designated L5-S1.Concordant with prior radiographs.   Alignment: Convex left scoliosis centered at L2-3, measuring 27 degrees. There is leftward listhesis of L4 relative to L5. The lateral alignment is normal.   Vertebrae: No worrisome osseous lesion, acute fracture or pars defect. Chronic endplate degenerative changes at L4-5. The visualized sacroiliac joints appear unremarkable.   Conus medullaris: Extends to the L1 level and appears normal.    Paraspinal and other soft tissues: No significant paraspinal findings. 2.8 cm cyst in the lower pole of the left kidney for which no follow-up imaging is recommended. 2.1 cm right adrenal nodule (image 2/17) is incompletely characterized, although likely an incidental adrenal adenoma.   Disc levels:   Sagittal images through the lower thoracic spine demonstrate mild disc bulging and asymmetric facet hypertrophy on the left at T10-11 and T11-12. No cord deformity. There is mild left-sided foraminal narrowing.   T12-L1: No significant findings.   L1-2: Disc height and hydration are maintained. Mild bilateral facet hypertrophy. No spinal stenosis or nerve root encroachment.   L2-3: Mild loss of disc height with mild disc bulging and endplate osteophytes asymmetric to the right. Mild bilateral facet hypertrophy. Mild to moderate right lateral recess and right foraminal narrowing. The spinal canal and left foramen are patent.   L3-4: Preserved disc height with mild disc bulging, facet and ligamentous hypertrophy. Mild right lateral recess narrowing. The spinal canal and both foramina are widely patent.   L4-5: Chronic loss of disc height with annular disc bulging and endplate osteophytes. As above, there is leftward listhesis of L4 relative to L5. Moderate facet and ligamentous hypertrophy. These factors contribute to mild to moderate multifactorial spinal stenosis with moderate narrowing of both lateral recesses, severe right and moderate left foraminal narrowing.   L5-S1: Relatively preserved disc height with annular disc bulging and endplate osteophytes asymmetric to the left. There is asymmetric left-sided facet hypertrophy. These factors contribute to severe left foraminal narrowing and probable left L5 nerve root encroachment. There is mild asymmetric narrowing of the left lateral recess. The spinal canal, right lateral recess and right foramen are patent.    IMPRESSION: 1. Convex left scoliosis with leftward listhesis of L4 relative to L5. No acute osseous findings. 2. Mild to moderate right lateral recess and right foraminal narrowing at L2-3. 3. Mild to moderate multifactorial spinal stenosis at L4-5 with moderate narrowing of both lateral recesses, severe right and moderate left foraminal narrowing. 4. Severe left foraminal narrowing at L5-S1 with probable left L5 nerve root encroachment. 5. Probable incidental right adrenal adenoma. Recommend more definitive characterization with adrenal washout CT or chemical shift MRI. JACR 2017 Aug; 14(8):1038-44, JCAT 2016 Mar-Apr; 40(2):194-200, Urol J 2006 Spring; 3(2):71-4.     Electronically Signed   By: Carey Bullocks M.D.   On: 04/02/2023 16:17  PMFS History: There are no problems to display for this patient.  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 REC.   Left shoulder pain  pt having pain and stiffness    Low back pain    S/P EPI INJECTION   Urinary frequency     Family History  Problem Relation Age of Onset   Cancer Father        liver    Past Surgical History:  Procedure Laterality Date   HERNIA REPAIR  1998   left inguinal hernia    LIPOMA EXCISION  11/20/2011   Procedure: EXCISION LIPOMA;  Surgeon: Iona Coach, MD;  Location: [facility];  Service: General;  Laterality: Left;   REDUCTION OF TORSION OF TESTIS  20 YRS AGO   LEFT   Social History   Occupational History   Not on file  Tobacco Use   Smoking status: Never   Smokeless tobacco: Never  Substance and Sexual Activity   Alcohol use: Yes    Comment: six beers per week or more   Drug use: No   Sexual activity: Not on file

## 2023-04-15 NOTE — Telephone Encounter (Signed)
Patient requesting tramadol sent to [facility] on [address]

## 2023-04-16 DIAGNOSIS — M5137 Other intervertebral disc degeneration, lumbosacral region: Secondary | ICD-10-CM | POA: Diagnosis not present

## 2023-04-16 DIAGNOSIS — M5136 Other intervertebral disc degeneration, lumbar region: Secondary | ICD-10-CM | POA: Diagnosis not present

## 2023-04-16 DIAGNOSIS — M5386 Other specified dorsopathies, lumbar region: Secondary | ICD-10-CM | POA: Diagnosis not present

## 2023-04-16 DIAGNOSIS — M4716 Other spondylosis with myelopathy, lumbar region: Secondary | ICD-10-CM | POA: Diagnosis not present

## 2023-04-20 ENCOUNTER — Other Ambulatory Visit: Payer: Self-pay

## 2023-04-20 ENCOUNTER — Ambulatory Visit: Payer: BC Managed Care – PPO | Admitting: Physical Medicine and Rehabilitation

## 2023-04-20 VITALS — BP 144/88 | HR 58

## 2023-04-20 DIAGNOSIS — M5416 Radiculopathy, lumbar region: Secondary | ICD-10-CM

## 2023-04-20 MED ORDER — METHYLPREDNISOLONE ACETATE 80 MG/ML IJ SUSP
80.0000 mg | Freq: Once | INTRAMUSCULAR | Status: AC
  Administered 2023-04-20: 80 mg

## 2023-04-20 NOTE — Progress Notes (Signed)
Functional Pain Scale - descriptive words and definitions  Moderate (4)   Constantly aware of pain, can complete ADLs with modification/sleep marginally affected at times/passive distraction is of no use, but active distraction gives some relief. Moderate range order  Average Pain  varies   +Driver, -BT, -Dye Allergies.  Lower back pain on left side with no radiation into legs. Medication and rest helps pain

## 2023-04-20 NOTE — Patient Instructions (Signed)

## 2023-04-20 NOTE — Progress Notes (Signed)
Kenneth Logan - 57 y.o. male MRN [id number]  Date of birth: [birth year]  Office Visit Note: Visit Date: 04/20/2023 PCP: Irven Coe, MD Referred by: Irven Coe, MD  Subjective: Chief Complaint  Patient presents with   Lower Back - Pain   HPI:  Kenneth Logan is a 57 y.o. male who comes in today at the request of Ellin Goodie, FNP for planned Left L5-S1 Lumbar Interlaminar epidural steroid injection with fluoroscopic guidance.  The patient has failed conservative care including home exercise, medications, time and activity modification.  This injection will be diagnostic and hopefully therapeutic.  Please see requesting physician notes for further details and justification.   ROS Otherwise per HPI.  Assessment & Plan: Visit Diagnoses:    ICD-10-CM   1. Lumbar radiculopathy  M54.16 XR C-ARM NO REPORT    Epidural Steroid injection    methylPREDNISolone acetate (DEPO-MEDROL) injection 80 mg      Plan: No additional findings.   Meds & Orders:  Meds ordered this encounter  Medications   methylPREDNISolone acetate (DEPO-MEDROL) injection 80 mg    Orders Placed This Encounter  Procedures   XR C-ARM NO REPORT   Epidural Steroid injection    Follow-up: Return for visit to requesting provider as needed.   Procedures: No procedures performed  Lumbar Epidural Steroid Injection - Interlaminar Approach with Fluoroscopic Guidance  Patient: Kenneth Logan      Date of Birth: 05-17-1966 MRN: [id number] PCP: Irven Coe, MD      Visit Date: 04/20/2023   Universal Protocol:     Consent Given By: the patient  Position: PRONE  Additional Comments: Vital signs were monitored before and after the procedure. Patient was prepped and draped in the usual sterile fashion. The correct patient, procedure, and site was verified.   Injection Procedure Details:   Procedure diagnoses: Lumbar radiculopathy [M54.16]   Meds Administered:  Meds ordered this encounter  Medications    methylPREDNISolone acetate (DEPO-MEDROL) injection 80 mg     Laterality: Left  Location/Site:  L5-S1  Needle: 3.5 in., 20 ga. Tuohy  Needle Placement: Paramedian epidural  Findings:   -Comments: Excellent flow of contrast into the epidural space. Due to L4-5 rotation and some transitional quality of L5 segment the injection was started right of midline to get to left of center. More flow of contrast to right. Consider repeat if not beneficial.  Procedure Details: Using a paramedian approach from the side mentioned above, the region overlying the inferior lamina was localized under fluoroscopic visualization and the soft tissues overlying this structure were infiltrated with 4 ml. of 1% Lidocaine without Epinephrine. The Tuohy needle was inserted into the epidural space using a paramedian approach.   The epidural space was localized using loss of resistance along with counter oblique bi-planar fluoroscopic views.  After negative aspirate for air, blood, and CSF, a 2 ml. volume of Isovue-250 was injected into the epidural space and the flow of contrast was observed. Radiographs were obtained for documentation purposes.    The injectate was administered into the level noted above.   Additional Comments:  No complications occurred Dressing: 2 x 2 sterile gauze and Band-Aid    Post-procedure details: Patient was observed during the procedure. Post-procedure instructions were reviewed.  Patient left the clinic in stable condition.   Clinical History: CLINICAL DATA:  Low back pain radiating into the left lower extremity for 1 month. No known injury or prior relevant surgery.   EXAM: MRI LUMBAR SPINE WITHOUT CONTRAST  TECHNIQUE: Multiplanar, multisequence MR imaging of the lumbar spine was performed. No intravenous contrast was administered.   COMPARISON:  Radiographs 03/04/2023.   FINDINGS: Segmentation: Conventional anatomy assumed, with the last open disc space designated  L5-S1.Concordant with prior radiographs.   Alignment: Convex left scoliosis centered at L2-3, measuring 27 degrees. There is leftward listhesis of L4 relative to L5. The lateral alignment is normal.   Vertebrae: No worrisome osseous lesion, acute fracture or pars defect. Chronic endplate degenerative changes at L4-5. The visualized sacroiliac joints appear unremarkable.   Conus medullaris: Extends to the L1 level and appears normal.   Paraspinal and other soft tissues: No significant paraspinal findings. 2.8 cm cyst in the lower pole of the left kidney for which no follow-up imaging is recommended. 2.1 cm right adrenal nodule (image 2/17) is incompletely characterized, although likely an incidental adrenal adenoma.   Disc levels:   Sagittal images through the lower thoracic spine demonstrate mild disc bulging and asymmetric facet hypertrophy on the left at T10-11 and T11-12. No cord deformity. There is mild left-sided foraminal narrowing.   T12-L1: No significant findings.   L1-2: Disc height and hydration are maintained. Mild bilateral facet hypertrophy. No spinal stenosis or nerve root encroachment.   L2-3: Mild loss of disc height with mild disc bulging and endplate osteophytes asymmetric to the right. Mild bilateral facet hypertrophy. Mild to moderate right lateral recess and right foraminal narrowing. The spinal canal and left foramen are patent.   L3-4: Preserved disc height with mild disc bulging, facet and ligamentous hypertrophy. Mild right lateral recess narrowing. The spinal canal and both foramina are widely patent.   L4-5: Chronic loss of disc height with annular disc bulging and endplate osteophytes. As above, there is leftward listhesis of L4 relative to L5. Moderate facet and ligamentous hypertrophy. These factors contribute to mild to moderate multifactorial spinal stenosis with moderate narrowing of both lateral recesses, severe right and moderate left  foraminal narrowing.   L5-S1: Relatively preserved disc height with annular disc bulging and endplate osteophytes asymmetric to the left. There is asymmetric left-sided facet hypertrophy. These factors contribute to severe left foraminal narrowing and probable left L5 nerve root encroachment. There is mild asymmetric narrowing of the left lateral recess. The spinal canal, right lateral recess and right foramen are patent.   IMPRESSION: 1. Convex left scoliosis with leftward listhesis of L4 relative to L5. No acute osseous findings. 2. Mild to moderate right lateral recess and right foraminal narrowing at L2-3. 3. Mild to moderate multifactorial spinal stenosis at L4-5 with moderate narrowing of both lateral recesses, severe right and moderate left foraminal narrowing. 4. Severe left foraminal narrowing at L5-S1 with probable left L5 nerve root encroachment. 5. Probable incidental right adrenal adenoma. Recommend more definitive characterization with adrenal washout CT or chemical shift MRI. JACR 2017 Aug; 14(8):1038-44, JCAT 2016 Mar-Apr; 40(2):194-200, Urol J 2006 Spring; 3(2):71-4.     Electronically Signed   By: Carey Bullocks M.D.   On: 04/02/2023 16:17     Objective:  VS:  HT:    WT:   BMI:     BP:(!) 141/90  HR:68bpm  TEMP: ( )  RESP:  Physical Exam Vitals and nursing note reviewed.  Constitutional:      General: He is not in acute distress.    Appearance: Normal appearance. He is not ill-appearing.  HENT:     Head: Normocephalic and atraumatic.     Right Ear: External ear normal.     Left  Ear: External ear normal.     Nose: No congestion.  Eyes:     Extraocular Movements: Extraocular movements intact.  Cardiovascular:     Rate and Rhythm: Normal rate.     Pulses: Normal pulses.  Pulmonary:     Effort: Pulmonary effort is normal. No respiratory distress.  Abdominal:     General: There is no distension.     Palpations: Abdomen is soft.   Musculoskeletal:        General: No tenderness or signs of injury.     Cervical back: Neck supple.     Right lower leg: No edema.     Left lower leg: No edema.     Comments: Patient has good distal strength without clonus.  Skin:    Findings: No erythema or rash.  Neurological:     General: No focal deficit present.     Mental Status: He is alert and oriented to person, place, and time.     Sensory: No sensory deficit.     Motor: No weakness or abnormal muscle tone.     Coordination: Coordination normal.  Psychiatric:        Mood and Affect: Mood normal.        Behavior: Behavior normal.      Imaging: XR C-ARM NO REPORT  Result Date: 04/20/2023 Please see Notes tab for imaging impression.

## 2023-04-20 NOTE — Procedures (Signed)
Lumbar Epidural Steroid Injection - Interlaminar Approach with Fluoroscopic Guidance  Patient: Kenneth Logan      Date of Birth: [birth year] MRN: [id number] PCP: Irven Coe, MD      Visit Date: 04/20/2023   Universal Protocol:     Consent Given By: the patient  Position: PRONE  Additional Comments: Vital signs were monitored before and after the procedure. Patient was prepped and draped in the usual sterile fashion. The correct patient, procedure, and site was verified.   Injection Procedure Details:   Procedure diagnoses: Lumbar radiculopathy [M54.16]   Meds Administered:  Meds ordered this encounter  Medications   methylPREDNISolone acetate (DEPO-MEDROL) injection 80 mg     Laterality: Left  Location/Site:  L5-S1  Needle: 3.5 in., 20 ga. Tuohy  Needle Placement: Paramedian epidural  Findings:   -Comments: Excellent flow of contrast into the epidural space. Due to L4-5 rotation and some transitional quality of L5 segment the injection was started right of midline to get to left of center. More flow of contrast to right. Consider repeat if not beneficial.  Procedure Details: Using a paramedian approach from the side mentioned above, the region overlying the inferior lamina was localized under fluoroscopic visualization and the soft tissues overlying this structure were infiltrated with 4 ml. of 1% Lidocaine without Epinephrine. The Tuohy needle was inserted into the epidural space using a paramedian approach.   The epidural space was localized using loss of resistance along with counter oblique bi-planar fluoroscopic views.  After negative aspirate for air, blood, and CSF, a 2 ml. volume of Isovue-250 was injected into the epidural space and the flow of contrast was observed. Radiographs were obtained for documentation purposes.    The injectate was administered into the level noted above.   Additional Comments:  No complications occurred Dressing: 2 x 2 sterile  gauze and Band-Aid    Post-procedure details: Patient was observed during the procedure. Post-procedure instructions were reviewed.  Patient left the clinic in stable condition.

## 2023-04-22 ENCOUNTER — Ambulatory Visit (HOSPITAL_COMMUNITY)
Admission: RE | Admit: 2023-04-22 | Discharge: 2023-04-22 | Disposition: A | Discharge status: Home / Self Care | Payer: BC Managed Care – PPO | Source: Ambulatory Visit | Attending: Family Medicine | Admitting: Family Medicine

## 2023-04-22 DIAGNOSIS — D3500 Benign neoplasm of unspecified adrenal gland: Secondary | ICD-10-CM | POA: Diagnosis not present

## 2023-04-22 DIAGNOSIS — N281 Cyst of kidney, acquired: Secondary | ICD-10-CM | POA: Diagnosis not present

## 2023-04-22 MED ORDER — IOHEXOL 350 MG/ML SOLN
100.0000 mL | Freq: Once | INTRAVENOUS | Status: AC | PRN
  Administered 2023-04-22: 100 mL via INTRAVENOUS

## 2023-05-04 DIAGNOSIS — I10 Essential (primary) hypertension: Secondary | ICD-10-CM | POA: Diagnosis not present

## 2023-05-04 DIAGNOSIS — Z125 Encounter for screening for malignant neoplasm of prostate: Secondary | ICD-10-CM | POA: Diagnosis not present

## 2023-05-04 DIAGNOSIS — M109 Gout, unspecified: Secondary | ICD-10-CM | POA: Diagnosis not present

## 2023-05-04 DIAGNOSIS — E669 Obesity, unspecified: Secondary | ICD-10-CM | POA: Diagnosis not present

## 2023-05-04 DIAGNOSIS — E782 Mixed hyperlipidemia: Secondary | ICD-10-CM | POA: Diagnosis not present

## 2023-05-04 DIAGNOSIS — Z0000 Encounter for general adult medical examination without abnormal findings: Secondary | ICD-10-CM | POA: Diagnosis not present

## 2023-05-22 ENCOUNTER — Telehealth: Payer: Self-pay | Admitting: Physical Medicine and Rehabilitation

## 2023-05-22 NOTE — Telephone Encounter (Signed)
Patient called. Would like to schedule with [facility].

## 2023-05-22 NOTE — Telephone Encounter (Signed)
Patient called needing to schedule an appointment with Dr. Alvester Morin for his lower back. The number to contact patient is [phone]

## 2023-05-22 NOTE — Telephone Encounter (Signed)
See previous encounter

## 2023-05-22 NOTE — Telephone Encounter (Signed)
Spoke with patient and he is requesting another injection. He states the last injection maybe lasted 2.5 weeks at 75% relief. He thinks he overworked himself and did not give the injection time to work. No new accidents, injuries or falls. Please advise

## 2023-05-25 ENCOUNTER — Other Ambulatory Visit: Payer: Self-pay | Admitting: Physical Medicine and Rehabilitation

## 2023-05-25 ENCOUNTER — Telehealth: Payer: Self-pay | Admitting: Physical Medicine and Rehabilitation

## 2023-05-25 DIAGNOSIS — M5416 Radiculopathy, lumbar region: Secondary | ICD-10-CM

## 2023-05-25 NOTE — Telephone Encounter (Signed)
I called patient to inform him that [employer] is requiring 3 months between injections. Last injection was 4/29. He can call us back with questions.

## 2023-06-16 DIAGNOSIS — M5416 Radiculopathy, lumbar region: Secondary | ICD-10-CM | POA: Diagnosis not present

## 2023-06-24 DIAGNOSIS — R300 Dysuria: Secondary | ICD-10-CM | POA: Diagnosis not present

## 2023-06-24 DIAGNOSIS — N401 Enlarged prostate with lower urinary tract symptoms: Secondary | ICD-10-CM | POA: Diagnosis not present

## 2023-06-24 DIAGNOSIS — N411 Chronic prostatitis: Secondary | ICD-10-CM | POA: Diagnosis not present

## 2023-07-04 DIAGNOSIS — M5416 Radiculopathy, lumbar region: Secondary | ICD-10-CM | POA: Diagnosis not present

## 2023-07-27 DIAGNOSIS — M5416 Radiculopathy, lumbar region: Secondary | ICD-10-CM | POA: Diagnosis not present

## 2023-07-27 DIAGNOSIS — M5136 Other intervertebral disc degeneration, lumbar region: Secondary | ICD-10-CM | POA: Diagnosis not present

## 2023-08-05 ENCOUNTER — Telehealth: Payer: Self-pay | Admitting: Physical Medicine and Rehabilitation

## 2023-08-05 NOTE — Telephone Encounter (Signed)
Patient came by. Cancel referral for injection. Going to [facility].

## 2023-08-05 NOTE — Telephone Encounter (Signed)
Referral cancelled. 

## 2023-09-22 ENCOUNTER — Telehealth: Payer: Self-pay | Admitting: Physical Medicine and Rehabilitation

## 2023-09-22 NOTE — Telephone Encounter (Signed)
Patient called and wanted to know if he get an appointment for an injection (Epidural). VE#[phone]

## 2023-09-25 ENCOUNTER — Other Ambulatory Visit: Payer: Self-pay | Admitting: Physical Medicine and Rehabilitation

## 2023-09-25 DIAGNOSIS — M5416 Radiculopathy, lumbar region: Secondary | ICD-10-CM

## 2023-09-25 NOTE — Telephone Encounter (Signed)
Spoke with patient and he stated he wants to get the injection now. He refused it in August because he was going to [facility]. He states they are having trouble getting it approved and would like to come back here. A new referral needs to be placed. Please advise

## 2023-10-14 ENCOUNTER — Ambulatory Visit: Payer: BC Managed Care – PPO | Admitting: Physical Medicine and Rehabilitation

## 2023-10-14 ENCOUNTER — Other Ambulatory Visit: Payer: Self-pay

## 2023-10-14 DIAGNOSIS — M5416 Radiculopathy, lumbar region: Secondary | ICD-10-CM

## 2023-10-14 MED ORDER — METHYLPREDNISOLONE ACETATE 40 MG/ML IJ SUSP
40.0000 mg | Freq: Once | INTRAMUSCULAR | Status: AC
  Administered 2023-10-14: 40 mg

## 2023-10-14 NOTE — Patient Instructions (Signed)

## 2023-10-14 NOTE — Progress Notes (Signed)
Functional Pain Scale - descriptive words and definitions  Unmanageable (7)  Pain interferes with normal ADL's/nothing seems to help/sleep is very difficult/active distractions are very difficult to concentrate on. Severe range order  Average Pain 7 152/93  +Driver, -BT, -Dye Allergies.

## 2023-10-21 NOTE — Progress Notes (Signed)
Kenneth Logan - 57 y.o. male MRN [id number]  Date of birth: May 03, 1966  Office Visit Note: Visit Date: 10/14/2023 PCP: Irven Coe, MD Referred by: Irven Coe, MD  Subjective: Chief Complaint  Patient presents with   Lower Back - Pain   HPI:  Kenneth Logan is a 57 y.o. male who comes in today at the request of Ellin Goodie, FNP for planned Left L5-S1 Lumbar Transforaminal epidural steroid injection with fluoroscopic guidance.  The patient has failed conservative care including home exercise, medications, time and activity modification.  This injection will be diagnostic and hopefully therapeutic.  Please see requesting physician notes for further details and justification.   ROS Otherwise per HPI.  Assessment & Plan: Visit Diagnoses:    ICD-10-CM   1. Lumbar radiculopathy  M54.16 methylPREDNISolone acetate (DEPO-MEDROL) injection 40 mg    XR C-ARM NO REPORT    Epidural Steroid injection      Plan: No additional findings.   Meds & Orders:  Meds ordered this encounter  Medications   methylPREDNISolone acetate (DEPO-MEDROL) injection 40 mg    Orders Placed This Encounter  Procedures   XR C-ARM NO REPORT   Epidural Steroid injection    Follow-up: Return for visit to requesting provider as needed.   Procedures: No procedures performed  Lumbosacral Transforaminal Epidural Steroid Injection - Sub-Pedicular Approach with Fluoroscopic Guidance  Patient: Kenneth Logan      Date of Birth: September 03, 1966 MRN: [id number] PCP: Irven Coe, MD      Visit Date: 10/14/2023   Universal Protocol:    Date/Time: 10/14/2023  Consent Given By: the patient  Position: PRONE  Additional Comments: Vital signs were monitored before and after the procedure. Patient was prepped and draped in the usual sterile fashion. The correct patient, procedure, and site was verified.   Injection Procedure Details:   Procedure diagnoses: Lumbar radiculopathy [M54.16]    Meds Administered:   Meds ordered this encounter  Medications   methylPREDNISolone acetate (DEPO-MEDROL) injection 40 mg    Laterality: Left  Location/Site: L5  Needle:5.0 in., 22 ga.  Short bevel or Quincke spinal needle  Needle Placement: Transforaminal  Findings:    -Comments: Excellent flow of contrast along the nerve, nerve root and into the epidural space.  Procedure Details: After squaring off the end-plates to get a true AP view, the C-arm was positioned so that an oblique view of the foramen as noted above was visualized. The target area is just inferior to the "nose of the scotty dog" or sub pedicular. The soft tissues overlying this structure were infiltrated with 2-3 ml. of 1% Lidocaine without Epinephrine.  The spinal needle was inserted toward the target using a "trajectory" view along the fluoroscope beam.  Under AP and lateral visualization, the needle was advanced so it did not puncture dura and was located close the 6 O'Clock position of the pedical in AP tracterory. Biplanar projections were used to confirm position. Aspiration was confirmed to be negative for CSF and/or blood. A 1-2 ml. volume of Isovue-250 was injected and flow of contrast was noted at each level. Radiographs were obtained for documentation purposes.   After attaining the desired flow of contrast documented above, a 0.5 to 1.0 ml test dose of 0.25% Marcaine was injected into each respective transforaminal space.  The patient was observed for 90 seconds post injection.  After no sensory deficits were reported, and normal lower extremity motor function was noted,   the above injectate was administered so that equal amounts  of the injectate were placed at each foramen (level) into the transforaminal epidural space.   Additional Comments:  No complications occurred Dressing: 2 x 2 sterile gauze and Band-Aid    Post-procedure details: Patient was observed during the procedure. Post-procedure instructions were  reviewed.  Patient left the clinic in stable condition.    Clinical History: CLINICAL DATA:  Low back pain radiating into the left lower extremity for 1 month. No known injury or prior relevant surgery.   EXAM: MRI LUMBAR SPINE WITHOUT CONTRAST   TECHNIQUE: Multiplanar, multisequence MR imaging of the lumbar spine was performed. No intravenous contrast was administered.   COMPARISON:  Radiographs 03/04/2023.   FINDINGS: Segmentation: Conventional anatomy assumed, with the last open disc space designated L5-S1.Concordant with prior radiographs.   Alignment: Convex left scoliosis centered at L2-3, measuring 27 degrees. There is leftward listhesis of L4 relative to L5. The lateral alignment is normal.   Vertebrae: No worrisome osseous lesion, acute fracture or pars defect. Chronic endplate degenerative changes at L4-5. The visualized sacroiliac joints appear unremarkable.   Conus medullaris: Extends to the L1 level and appears normal.   Paraspinal and other soft tissues: No significant paraspinal findings. 2.8 cm cyst in the lower pole of the left kidney for which no follow-up imaging is recommended. 2.1 cm right adrenal nodule (image 2/17) is incompletely characterized, although likely an incidental adrenal adenoma.   Disc levels:   Sagittal images through the lower thoracic spine demonstrate mild disc bulging and asymmetric facet hypertrophy on the left at T10-11 and T11-12. No cord deformity. There is mild left-sided foraminal narrowing.   T12-L1: No significant findings.   L1-2: Disc height and hydration are maintained. Mild bilateral facet hypertrophy. No spinal stenosis or nerve root encroachment.   L2-3: Mild loss of disc height with mild disc bulging and endplate osteophytes asymmetric to the right. Mild bilateral facet hypertrophy. Mild to moderate right lateral recess and right foraminal narrowing. The spinal canal and left foramen are patent.   L3-4:  Preserved disc height with mild disc bulging, facet and ligamentous hypertrophy. Mild right lateral recess narrowing. The spinal canal and both foramina are widely patent.   L4-5: Chronic loss of disc height with annular disc bulging and endplate osteophytes. As above, there is leftward listhesis of L4 relative to L5. Moderate facet and ligamentous hypertrophy. These factors contribute to mild to moderate multifactorial spinal stenosis with moderate narrowing of both lateral recesses, severe right and moderate left foraminal narrowing.   L5-S1: Relatively preserved disc height with annular disc bulging and endplate osteophytes asymmetric to the left. There is asymmetric left-sided facet hypertrophy. These factors contribute to severe left foraminal narrowing and probable left L5 nerve root encroachment. There is mild asymmetric narrowing of the left lateral recess. The spinal canal, right lateral recess and right foramen are patent.   IMPRESSION: 1. Convex left scoliosis with leftward listhesis of L4 relative to L5. No acute osseous findings. 2. Mild to moderate right lateral recess and right foraminal narrowing at L2-3. 3. Mild to moderate multifactorial spinal stenosis at L4-5 with moderate narrowing of both lateral recesses, severe right and moderate left foraminal narrowing. 4. Severe left foraminal narrowing at L5-S1 with probable left L5 nerve root encroachment. 5. Probable incidental right adrenal adenoma. Recommend more definitive characterization with adrenal washout CT or chemical shift MRI. JACR 2017 Aug; 14(8):1038-44, JCAT 2016 Mar-Apr; 40(2):194-200, Urol J 2006 Spring; 3(2):71-4.     Electronically Signed   By: Hilarie Fredrickson.D.  On: 04/02/2023 16:17     Objective:  VS:  HT:    WT:   BMI:     BP:   HR: bpm  TEMP: ( )  RESP:  Physical Exam Vitals and nursing note reviewed.  Constitutional:      General: He is not in acute distress.    Appearance:  Normal appearance. He is not ill-appearing.  HENT:     Head: Normocephalic and atraumatic.     Right Ear: External ear normal.     Left Ear: External ear normal.     Nose: No congestion.  Eyes:     Extraocular Movements: Extraocular movements intact.  Cardiovascular:     Rate and Rhythm: Normal rate.     Pulses: Normal pulses.  Pulmonary:     Effort: Pulmonary effort is normal. No respiratory distress.  Abdominal:     General: There is no distension.     Palpations: Abdomen is soft.  Musculoskeletal:        General: No tenderness or signs of injury.     Cervical back: Neck supple.     Right lower leg: No edema.     Left lower leg: No edema.     Comments: Patient has good distal strength without clonus.  Skin:    Findings: No erythema or rash.  Neurological:     General: No focal deficit present.     Mental Status: He is alert and oriented to person, place, and time.     Sensory: No sensory deficit.     Motor: No weakness or abnormal muscle tone.     Coordination: Coordination normal.  Psychiatric:        Mood and Affect: Mood normal.        Behavior: Behavior normal.      Imaging: No results found.

## 2023-10-21 NOTE — Procedures (Signed)
Lumbosacral Transforaminal Epidural Steroid Injection - Sub-Pedicular Approach with Fluoroscopic Guidance  Patient: Kenneth Logan      Date of Birth: 03-Mar-1966 MRN: [id number] PCP: Irven Coe, MD      Visit Date: 10/14/2023   Universal Protocol:    Date/Time: 10/14/2023  Consent Given By: the patient  Position: PRONE  Additional Comments: Vital signs were monitored before and after the procedure. Patient was prepped and draped in the usual sterile fashion. The correct patient, procedure, and site was verified.   Injection Procedure Details:   Procedure diagnoses: Lumbar radiculopathy [M54.16]    Meds Administered:  Meds ordered this encounter  Medications   methylPREDNISolone acetate (DEPO-MEDROL) injection 40 mg    Laterality: Left  Location/Site: L5  Needle:5.0 in., 22 ga.  Short bevel or Quincke spinal needle  Needle Placement: Transforaminal  Findings:    -Comments: Excellent flow of contrast along the nerve, nerve root and into the epidural space.  Procedure Details: After squaring off the end-plates to get a true AP view, the C-arm was positioned so that an oblique view of the foramen as noted above was visualized. The target area is just inferior to the "nose of the scotty dog" or sub pedicular. The soft tissues overlying this structure were infiltrated with 2-3 ml. of 1% Lidocaine without Epinephrine.  The spinal needle was inserted toward the target using a "trajectory" view along the fluoroscope beam.  Under AP and lateral visualization, the needle was advanced so it did not puncture dura and was located close the 6 O'Clock position of the pedical in AP tracterory. Biplanar projections were used to confirm position. Aspiration was confirmed to be negative for CSF and/or blood. A 1-2 ml. volume of Isovue-250 was injected and flow of contrast was noted at each level. Radiographs were obtained for documentation purposes.   After attaining the desired flow of  contrast documented above, a 0.5 to 1.0 ml test dose of 0.25% Marcaine was injected into each respective transforaminal space.  The patient was observed for 90 seconds post injection.  After no sensory deficits were reported, and normal lower extremity motor function was noted,   the above injectate was administered so that equal amounts of the injectate were placed at each foramen (level) into the transforaminal epidural space.   Additional Comments:  No complications occurred Dressing: 2 x 2 sterile gauze and Band-Aid    Post-procedure details: Patient was observed during the procedure. Post-procedure instructions were reviewed.  Patient left the clinic in stable condition.

## 2023-11-02 DIAGNOSIS — I10 Essential (primary) hypertension: Secondary | ICD-10-CM | POA: Diagnosis not present

## 2023-11-04 DIAGNOSIS — I10 Essential (primary) hypertension: Secondary | ICD-10-CM | POA: Diagnosis not present

## 2023-11-04 DIAGNOSIS — E782 Mixed hyperlipidemia: Secondary | ICD-10-CM | POA: Diagnosis not present

## 2023-11-04 DIAGNOSIS — M109 Gout, unspecified: Secondary | ICD-10-CM | POA: Diagnosis not present

## 2023-11-04 DIAGNOSIS — D3500 Benign neoplasm of unspecified adrenal gland: Secondary | ICD-10-CM | POA: Diagnosis not present

## 2023-12-10 DIAGNOSIS — N529 Male erectile dysfunction, unspecified: Secondary | ICD-10-CM | POA: Diagnosis not present

## 2023-12-10 DIAGNOSIS — R051 Acute cough: Secondary | ICD-10-CM | POA: Diagnosis not present

## 2023-12-19 ENCOUNTER — Emergency Department (HOSPITAL_BASED_OUTPATIENT_CLINIC_OR_DEPARTMENT_OTHER)
Admission: EM | Admit: 2023-12-19 | Discharge: 2023-12-19 | Discharge status: Against Medical Advice | Payer: BC Managed Care – PPO

## 2023-12-19 ENCOUNTER — Other Ambulatory Visit: Payer: Self-pay

## 2023-12-19 DIAGNOSIS — R0989 Other specified symptoms and signs involving the circulatory and respiratory systems: Secondary | ICD-10-CM | POA: Insufficient documentation

## 2023-12-19 DIAGNOSIS — R0982 Postnasal drip: Secondary | ICD-10-CM | POA: Diagnosis not present

## 2023-12-19 DIAGNOSIS — Z1152 Encounter for screening for COVID-19: Secondary | ICD-10-CM | POA: Diagnosis not present

## 2023-12-19 DIAGNOSIS — Z5321 Procedure and treatment not carried out due to patient leaving prior to being seen by health care provider: Secondary | ICD-10-CM | POA: Insufficient documentation

## 2023-12-19 LAB — RESP PANEL BY RT-PCR (RSV, FLU A&B, COVID)  RVPGX2
Influenza A by PCR: NEGATIVE
Influenza B by PCR: NEGATIVE
Resp Syncytial Virus by PCR: NEGATIVE
SARS Coronavirus 2 by RT PCR: NEGATIVE

## 2023-12-19 NOTE — ED Triage Notes (Signed)
Chest congestion, nasal drainage, exposed to flu. No fevers , no body aches.

## 2023-12-19 NOTE — ED Notes (Signed)
Pt ambulated out of the room stating "Ill be back later. I need to leave. They already told me my results of my swap. Ive got something to do." Pt informed that its up to him and if he leaves itll be against medical advise, since he hasn't seen the provider yet. Pt understood and again stated that he would be back later... Provider informed... Pt ambulated out of the ER before the Provider was able to speak with the PT... Pt was CAOx4.Marland KitchenMarland Kitchen

## 2023-12-20 ENCOUNTER — Other Ambulatory Visit: Payer: Self-pay

## 2023-12-20 ENCOUNTER — Encounter (HOSPITAL_BASED_OUTPATIENT_CLINIC_OR_DEPARTMENT_OTHER): Payer: Self-pay

## 2023-12-20 ENCOUNTER — Emergency Department (HOSPITAL_BASED_OUTPATIENT_CLINIC_OR_DEPARTMENT_OTHER)
Admission: EM | Admit: 2023-12-20 | Discharge: 2023-12-20 | Disposition: A | Discharge status: Home / Self Care | Payer: BC Managed Care – PPO | Attending: Emergency Medicine | Admitting: Emergency Medicine

## 2023-12-20 DIAGNOSIS — R059 Cough, unspecified: Secondary | ICD-10-CM | POA: Diagnosis not present

## 2023-12-20 DIAGNOSIS — Z7982 Long term (current) use of aspirin: Secondary | ICD-10-CM | POA: Diagnosis not present

## 2023-12-20 DIAGNOSIS — J069 Acute upper respiratory infection, unspecified: Secondary | ICD-10-CM | POA: Diagnosis not present

## 2023-12-20 DIAGNOSIS — B9789 Other viral agents as the cause of diseases classified elsewhere: Secondary | ICD-10-CM | POA: Diagnosis not present

## 2023-12-20 NOTE — ED Provider Notes (Signed)
[facility]  Provider Note  CSN: [id number] Arrival date & time: 12/20/23 0023  History Chief Complaint  Patient presents with   Nasal Congestion    Kenneth Logan is a 57 y.o. male here with 2 days of nasal congestion and cough, no fever. Reports he was exposed to the flu. He was here earlier but left to go to a meeting and returns to discuss results of his nasal swab which was negative. He is also concerned because he is scheduled to go on a cruise in 2 days.    Home Medications Prior to Admission medications   Medication Sig Start Date End Date Taking? Authorizing Provider  allopurinol (ZYLOPRIM) 100 MG tablet Take 200 mg by mouth daily.    [provider]  amLODipine (NORVASC) 5 MG tablet Take 5 mg by mouth daily. 04/07/22   [provider]  aspirin EC 81 MG tablet Take 81 mg by mouth daily.    [provider]  atorvastatin (LIPITOR) 40 MG tablet Take 40 mg by mouth daily. 05/11/22   [provider]  cholecalciferol (VITAMIN D) 1000 UNITS tablet Take 1,000 Units by mouth daily.      [provider]  colchicine 0.6 MG tablet Take 2 tablets and one hour later take one tablet. 06/12/22   Alvira Monday, MD  diphenhydrAMINE (BENADRYL) 2 % cream Apply 1 application topically 2 (two) times daily as needed. For rash    [provider]  diphenhydrAMINE (BENADRYL) 25 MG tablet Take 1 tablet (25 mg total) by mouth every 6 (six) hours. 06/06/12 07/06/12  Kirichenko, Lemont Fillers, PA-C  doxycycline (VIBRAMYCIN) 100 MG capsule Take 1 capsule (100 mg total) by mouth 2 (two) times daily. One po bid x 7 days 02/01/23   Molpus, John, MD  lidocaine (LIDODERM) 5 % Place 1 patch onto the skin daily. Remove & Discard patch within 12 hours or as directed by MD 06/12/22   Alvira Monday, MD  meloxicam (MOBIC) 15 MG tablet Take 1 tablet (15 mg total) by mouth daily. 03/04/23 03/03/24  Juanda Chance, NP  Multiple  Vitamin (MULTIVITAMIN) tablet Take 1 tablet by mouth daily.      [provider]  olmesartan-hydrochlorothiazide (BENICAR HCT) 40-25 MG tablet Take 1 tablet by mouth every morning. 04/07/22   [provider]  predniSONE (DELTASONE) 50 MG tablet Take 1 tablet (50 mg total) by mouth daily with breakfast. Take until completed. 03/04/23   Juanda Chance, NP  pseudoephedrine (SUDAFED) 120 MG 12 hr tablet Take 1 tablet (120 mg total) by mouth every 12 (twelve) hours as needed for congestion. 02/01/23   Molpus, John, MD  simvastatin (ZOCOR) 40 MG tablet Take 40 mg by mouth daily.    [provider]  traMADol (ULTRAM) 50 MG tablet Take 1 tablet (50 mg total) by mouth every 8 (eight) hours as needed. 04/15/23   Juanda Chance, NP  valsartan-hydrochlorothiazide (DIOVAN-HCT) 160-25 MG per tablet Take 1 tablet by mouth daily.    [provider]     Allergies    Patient has no known allergies.   Review of Systems   Review of Systems Please see HPI for pertinent positives and negatives  Physical Exam BP (!) 164/90   Pulse 95   Temp 98.2 F (36.8 C)   Resp 18   Ht 6' (1.829 m)   Wt 91.6 kg   SpO2 98%   BMI 27.40 kg/m   Physical Exam  Vitals and nursing note reviewed.  HENT:     Head: Normocephalic.     Nose: Nose normal.  Eyes:     Extraocular Movements: Extraocular movements intact.  Pulmonary:     Effort: Pulmonary effort is normal.  Musculoskeletal:        General: Normal range of motion.     Cervical back: Neck supple.  Skin:    Findings: No rash (on exposed skin).  Neurological:     Mental Status: He is alert and oriented to person, place, and time.  Psychiatric:        Mood and Affect: Mood normal.     ED Results / Procedures / Treatments   EKG None  Procedures Procedures  Medications Ordered in the ED Medications - No data to display  Initial Impression and Plan  Patient here with two days of mild URI symptoms. Vitals are  reassuring. Swab from earlier is negative. Advised supportive care with OTC meds, no specific antibiotics or antivirals are indicated. Safe to go on cruise if he is not running a fever. PCP follow up, RTED for any other concerns.    ED Course       MDM Rules/Calculators/A&P Medical Decision Making Problems Addressed: Viral URI with cough: acute illness or injury  Amount and/or Complexity of Data Reviewed Labs:  Decision-making details documented in ED Course.  Risk OTC drugs.     Final Clinical Impression(s) / ED Diagnoses Final diagnoses:  Viral URI with cough    Rx / DC Orders ED Discharge Orders     None        Pollyann Savoy, MD 12/20/23 [phone]

## 2023-12-20 NOTE — ED Triage Notes (Signed)
Nonproductive cough and nasal congestion beginning yesterday.   Seen earlier but left for a meeting. Says he can not see the results on mychart and wanted to review his swab.

## 2023-12-28 ENCOUNTER — Telehealth: Payer: Self-pay | Admitting: Physical Medicine and Rehabilitation

## 2023-12-28 DIAGNOSIS — R300 Dysuria: Secondary | ICD-10-CM | POA: Diagnosis not present

## 2023-12-28 DIAGNOSIS — M5416 Radiculopathy, lumbar region: Secondary | ICD-10-CM

## 2023-12-28 DIAGNOSIS — J069 Acute upper respiratory infection, unspecified: Secondary | ICD-10-CM | POA: Diagnosis not present

## 2023-12-28 NOTE — Telephone Encounter (Signed)
 Pt called requesting an appt for back injection. Last injection 09/2023. Pt phone number is [phone].

## 2024-01-07 DIAGNOSIS — B081 Molluscum contagiosum: Secondary | ICD-10-CM | POA: Diagnosis not present

## 2024-01-07 DIAGNOSIS — D485 Neoplasm of uncertain behavior of skin: Secondary | ICD-10-CM | POA: Diagnosis not present

## 2024-01-11 DIAGNOSIS — N411 Chronic prostatitis: Secondary | ICD-10-CM | POA: Diagnosis not present

## 2024-01-11 DIAGNOSIS — Z3141 Encounter for fertility testing: Secondary | ICD-10-CM | POA: Diagnosis not present

## 2024-01-11 DIAGNOSIS — R300 Dysuria: Secondary | ICD-10-CM | POA: Diagnosis not present

## 2024-01-13 DIAGNOSIS — N419 Inflammatory disease of prostate, unspecified: Secondary | ICD-10-CM | POA: Diagnosis not present

## 2024-01-15 DIAGNOSIS — N368 Other specified disorders of urethra: Secondary | ICD-10-CM | POA: Diagnosis not present

## 2024-01-15 DIAGNOSIS — N529 Male erectile dysfunction, unspecified: Secondary | ICD-10-CM | POA: Diagnosis not present

## 2024-01-15 DIAGNOSIS — M6289 Other specified disorders of muscle: Secondary | ICD-10-CM | POA: Diagnosis not present

## 2024-01-18 IMAGING — DX DG KNEE COMPLETE 4+V*R*
4 series · 4 of 4 positions shown · non-contrast
Comparison: None Available.

CLINICAL DATA: Pain

EXAM:
RIGHT KNEE - COMPLETE 4+ VIEW

[knee ap]
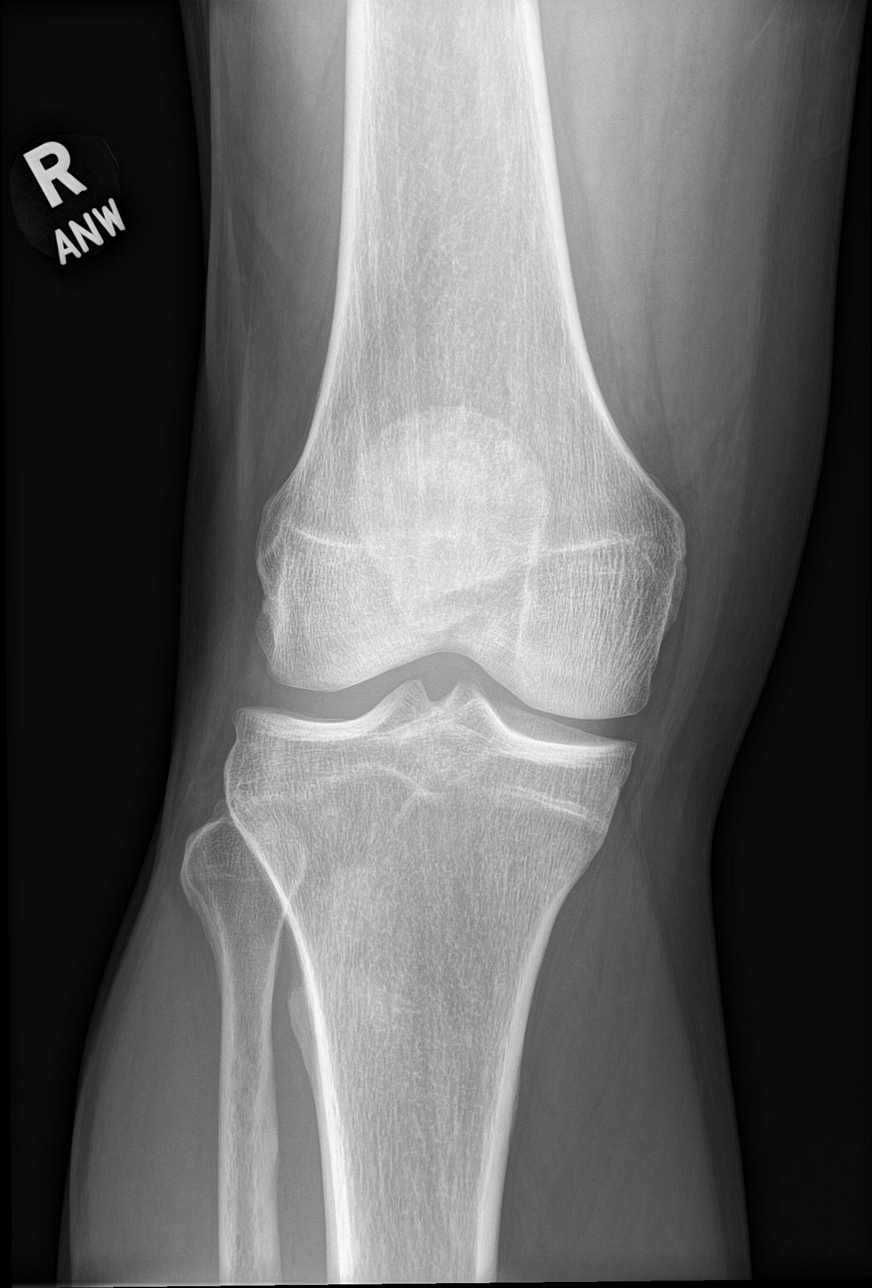

[knee obl (1 of 2)]
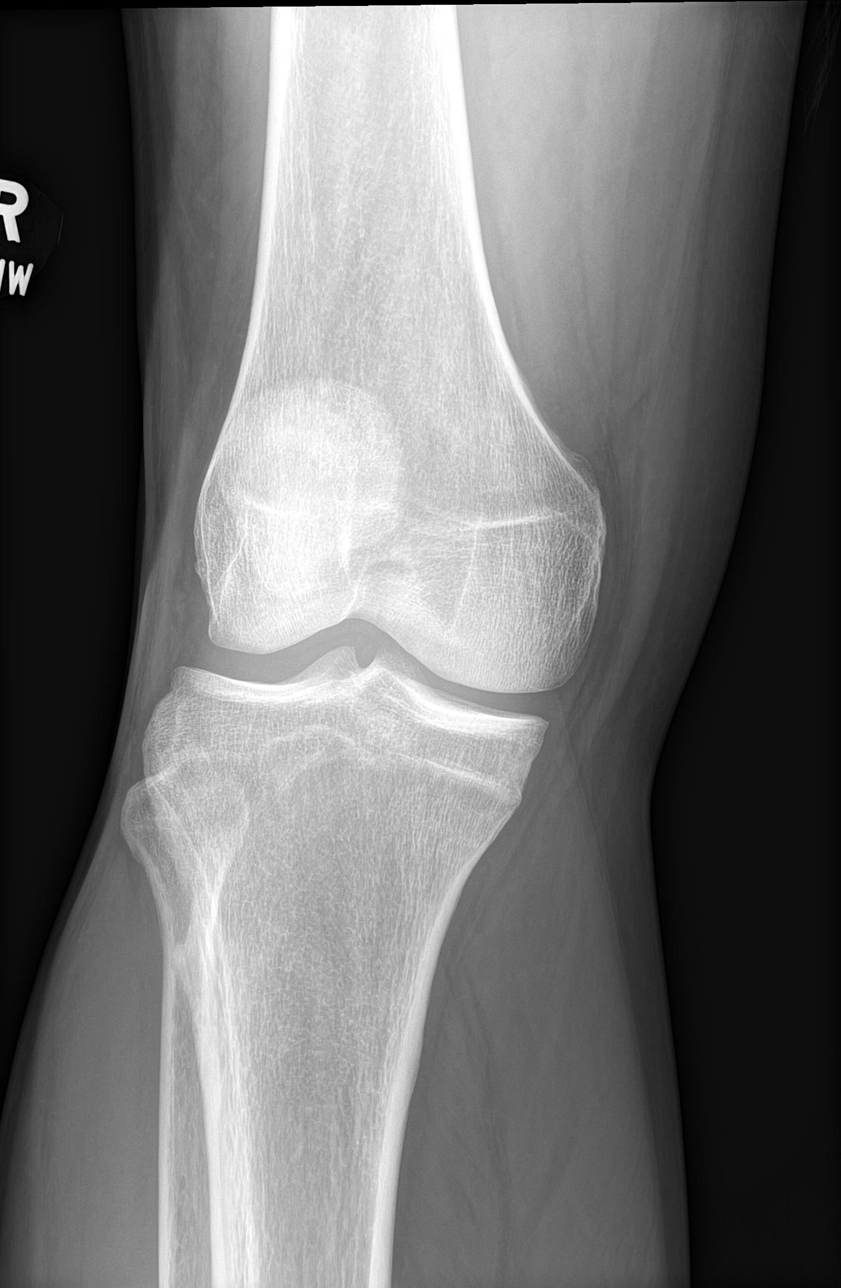

[knee obl (2 of 2)]
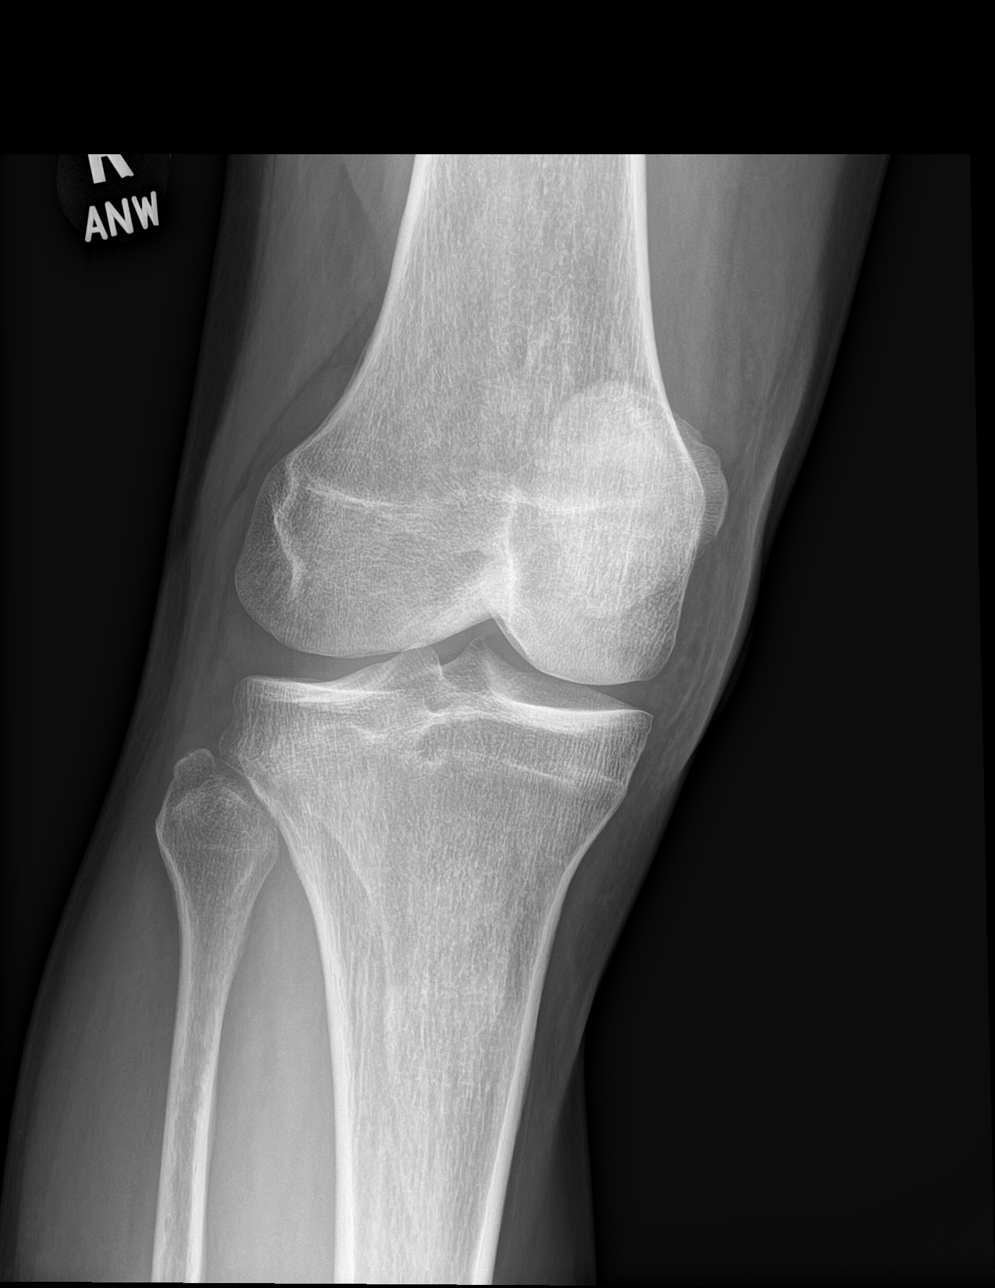

[knee lat]
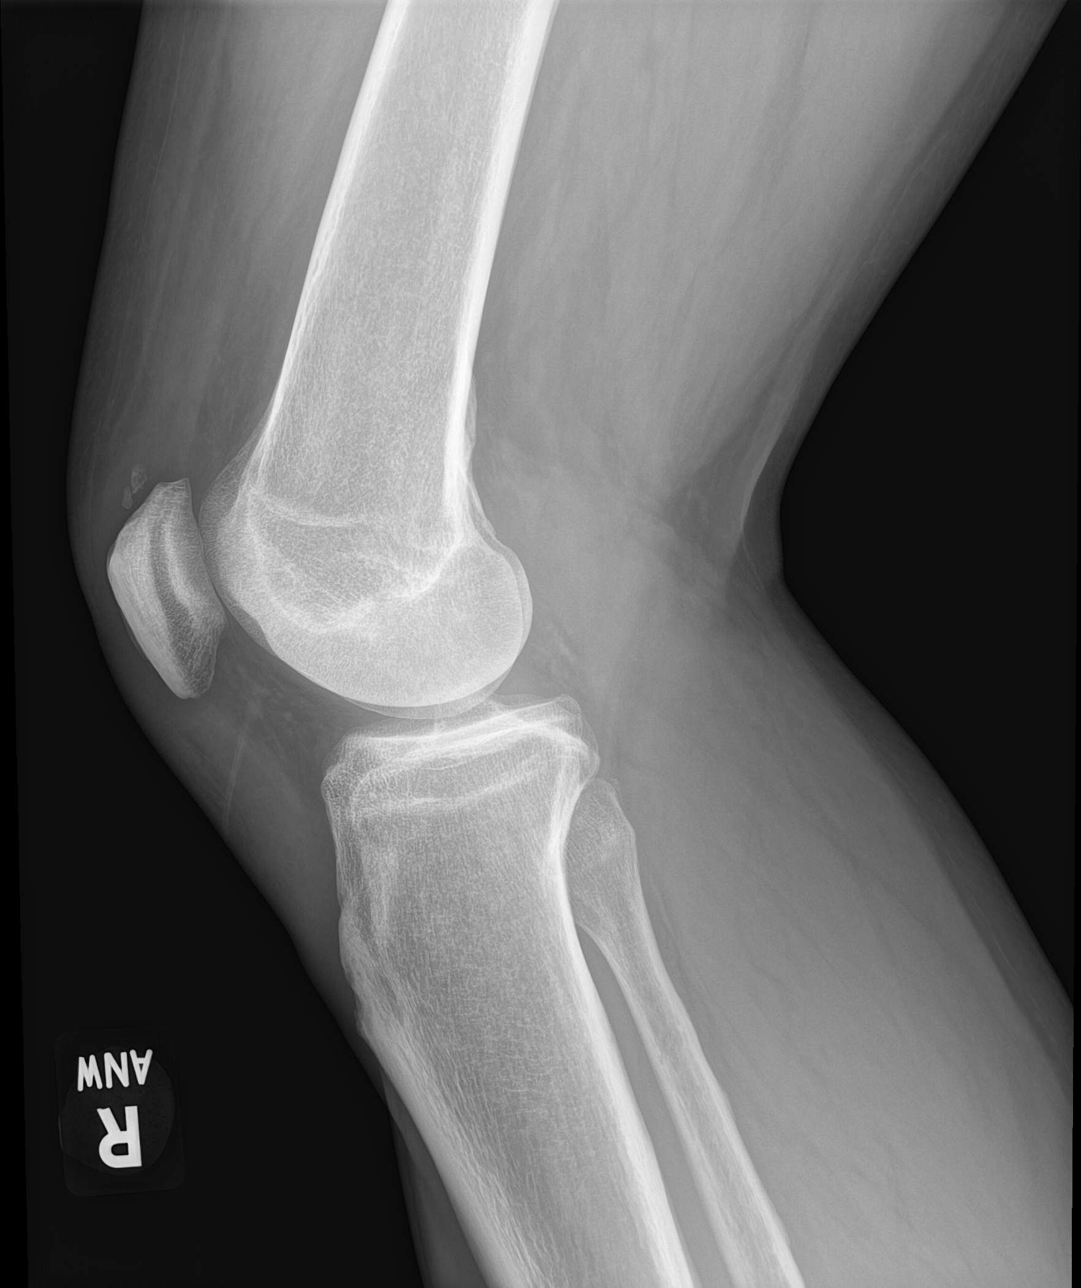

[4 of 4 positions shown; findings below may reference images not displayed]

FINDINGS: No recent fracture or dislocation is seen. There are small smooth
marginated calcifications in the quadriceps tendon close to patella.
There is no effusion in the suprapatellar bursa.
IMPRESSION: No fracture or dislocation is seen.  There is no effusion.

Small smooth marginated calcifications in the quadriceps tendon
close to the upper margin of patella may suggest calcific
tendinosis.

## 2024-01-25 DIAGNOSIS — M6289 Other specified disorders of muscle: Secondary | ICD-10-CM | POA: Diagnosis not present

## 2024-01-25 DIAGNOSIS — R3989 Other symptoms and signs involving the genitourinary system: Secondary | ICD-10-CM | POA: Diagnosis not present

## 2024-01-25 DIAGNOSIS — N529 Male erectile dysfunction, unspecified: Secondary | ICD-10-CM | POA: Diagnosis not present

## 2024-01-26 DIAGNOSIS — Z3141 Encounter for fertility testing: Secondary | ICD-10-CM | POA: Diagnosis not present

## 2024-02-02 ENCOUNTER — Telehealth: Payer: Self-pay | Admitting: Physical Medicine and Rehabilitation

## 2024-02-02 NOTE — Telephone Encounter (Signed)
Patient states injection helped

## 2024-02-08 DIAGNOSIS — M9903 Segmental and somatic dysfunction of lumbar region: Secondary | ICD-10-CM | POA: Diagnosis not present

## 2024-02-08 DIAGNOSIS — M5136 Other intervertebral disc degeneration, lumbar region with discogenic back pain only: Secondary | ICD-10-CM | POA: Diagnosis not present

## 2024-02-08 DIAGNOSIS — M47816 Spondylosis without myelopathy or radiculopathy, lumbar region: Secondary | ICD-10-CM | POA: Diagnosis not present

## 2024-02-08 DIAGNOSIS — M9902 Segmental and somatic dysfunction of thoracic region: Secondary | ICD-10-CM | POA: Diagnosis not present

## 2024-02-08 DIAGNOSIS — M9905 Segmental and somatic dysfunction of pelvic region: Secondary | ICD-10-CM | POA: Diagnosis not present

## 2024-02-17 ENCOUNTER — Telehealth: Payer: Self-pay | Admitting: Physical Medicine and Rehabilitation

## 2024-02-17 DIAGNOSIS — M47816 Spondylosis without myelopathy or radiculopathy, lumbar region: Secondary | ICD-10-CM | POA: Diagnosis not present

## 2024-02-17 DIAGNOSIS — M9903 Segmental and somatic dysfunction of lumbar region: Secondary | ICD-10-CM | POA: Diagnosis not present

## 2024-02-17 DIAGNOSIS — M5136 Other intervertebral disc degeneration, lumbar region with discogenic back pain only: Secondary | ICD-10-CM | POA: Diagnosis not present

## 2024-02-17 DIAGNOSIS — M9905 Segmental and somatic dysfunction of pelvic region: Secondary | ICD-10-CM | POA: Diagnosis not present

## 2024-02-17 NOTE — Telephone Encounter (Signed)
 Pt called states injection helped and would like to schedule for next injection to be on Dr Alvester Morin appt future date. Please call pt at [phone]

## 2024-02-19 ENCOUNTER — Telehealth: Payer: Self-pay

## 2024-02-19 NOTE — Telephone Encounter (Signed)
 Patient advised he got 95% relief/ and ability improvement Duration of Relief 3 months  Current pain score 7 No recent falls of injuries Location is lower back on left site.

## 2024-02-26 DIAGNOSIS — M5136 Other intervertebral disc degeneration, lumbar region with discogenic back pain only: Secondary | ICD-10-CM | POA: Diagnosis not present

## 2024-02-26 DIAGNOSIS — M9905 Segmental and somatic dysfunction of pelvic region: Secondary | ICD-10-CM | POA: Diagnosis not present

## 2024-02-26 DIAGNOSIS — M9903 Segmental and somatic dysfunction of lumbar region: Secondary | ICD-10-CM | POA: Diagnosis not present

## 2024-02-26 DIAGNOSIS — M47816 Spondylosis without myelopathy or radiculopathy, lumbar region: Secondary | ICD-10-CM | POA: Diagnosis not present

## 2024-03-02 ENCOUNTER — Ambulatory Visit: Admitting: Physical Medicine and Rehabilitation

## 2024-03-02 ENCOUNTER — Other Ambulatory Visit: Payer: Self-pay

## 2024-03-02 VITALS — BP 157/88 | HR 64

## 2024-03-02 DIAGNOSIS — D225 Melanocytic nevi of trunk: Secondary | ICD-10-CM | POA: Diagnosis not present

## 2024-03-02 DIAGNOSIS — M5416 Radiculopathy, lumbar region: Secondary | ICD-10-CM

## 2024-03-02 DIAGNOSIS — B360 Pityriasis versicolor: Secondary | ICD-10-CM | POA: Diagnosis not present

## 2024-03-02 DIAGNOSIS — L905 Scar conditions and fibrosis of skin: Secondary | ICD-10-CM | POA: Diagnosis not present

## 2024-03-02 MED ORDER — METHYLPREDNISOLONE ACETATE 40 MG/ML IJ SUSP
40.0000 mg | Freq: Once | INTRAMUSCULAR | Status: AC
  Administered 2024-03-02: 40 mg

## 2024-03-02 NOTE — Patient Instructions (Signed)

## 2024-03-02 NOTE — Progress Notes (Signed)
 Pain Scale   Average Pain 3        +Driver, -BT, -Dye Allergies.

## 2024-03-02 NOTE — Progress Notes (Signed)
 Kenneth Logan - 58 y.o. male MRN [id number]  Date of birth: November 22, 1966  Office Visit Note: Visit Date: 03/02/2024 PCP: Irven Coe, MD Referred by: Irven Coe, MD  Subjective: Chief Complaint  Patient presents with   Lower Back - Pain   HPI:  Kenneth Logan is a 58 y.o. male who comes in today for planned repeat Left L5-S1  Lumbar Transforaminal epidural steroid injection with fluoroscopic guidance.  The patient has failed conservative care including home exercise, medications, time and activity modification.  This injection will be diagnostic and hopefully therapeutic.  Please see requesting physician notes for further details and justification. Patient received more than 50% pain relief from prior injection.   Referring: Ellin Goodie, FNP   ROS Otherwise per HPI.  Assessment & Plan: Visit Diagnoses:    ICD-10-CM   1. Lumbar radiculopathy  M54.16 XR C-ARM NO REPORT    Epidural Steroid injection    methylPREDNISolone acetate (DEPO-MEDROL) injection 40 mg      Plan: No additional findings.   Meds & Orders:  Meds ordered this encounter  Medications   methylPREDNISolone acetate (DEPO-MEDROL) injection 40 mg    Orders Placed This Encounter  Procedures   XR C-ARM NO REPORT   Epidural Steroid injection    Follow-up: Return if symptoms worsen or fail to improve.   Procedures: No procedures performed  Lumbosacral Transforaminal Epidural Steroid Injection - Sub-Pedicular Approach with Fluoroscopic Guidance  Patient: Kenneth Logan      Date of Birth: 1966-01-24 MRN: [id number] PCP: Irven Coe, MD      Visit Date: 03/02/2024   Universal Protocol:    Date/Time: 03/02/2024  Consent Given By: the patient  Position: PRONE  Additional Comments: Vital signs were monitored before and after the procedure. Patient was prepped and draped in the usual sterile fashion. The correct patient, procedure, and site was verified.   Injection Procedure Details:   Procedure  diagnoses: Lumbar radiculopathy [M54.16]    Meds Administered:  Meds ordered this encounter  Medications   methylPREDNISolone acetate (DEPO-MEDROL) injection 40 mg    Laterality: Left  Location/Site: L5  Needle:5.0 in., 22 ga.  Short bevel or Quincke spinal needle  Needle Placement: Transforaminal  Findings:    -Comments: Excellent flow of contrast along the nerve, nerve root and into the epidural space.  Procedure Details: After squaring off the end-plates to get a true AP view, the C-arm was positioned so that an oblique view of the foramen as noted above was visualized. The target area is just inferior to the "nose of the scotty dog" or sub pedicular. The soft tissues overlying this structure were infiltrated with 2-3 ml. of 1% Lidocaine without Epinephrine.  The spinal needle was inserted toward the target using a "trajectory" view along the fluoroscope beam.  Under AP and lateral visualization, the needle was advanced so it did not puncture dura and was located close the 6 O'Clock position of the pedical in AP tracterory. Biplanar projections were used to confirm position. Aspiration was confirmed to be negative for CSF and/or blood. A 1-2 ml. volume of Isovue-250 was injected and flow of contrast was noted at each level. Radiographs were obtained for documentation purposes.   After attaining the desired flow of contrast documented above, a 0.5 to 1.0 ml test dose of 0.25% Marcaine was injected into each respective transforaminal space.  The patient was observed for 90 seconds post injection.  After no sensory deficits were reported, and normal lower extremity motor function was noted,  the above injectate was administered so that equal amounts of the injectate were placed at each foramen (level) into the transforaminal epidural space.   Additional Comments:  No complications occurred Dressing: 2 x 2 sterile gauze and Band-Aid    Post-procedure details: Patient was observed  during the procedure. Post-procedure instructions were reviewed.  Patient left the clinic in stable condition.    Clinical History: CLINICAL DATA:  Low back pain radiating into the left lower extremity for 1 month. No known injury or prior relevant surgery.   EXAM: MRI LUMBAR SPINE WITHOUT CONTRAST   TECHNIQUE: Multiplanar, multisequence MR imaging of the lumbar spine was performed. No intravenous contrast was administered.   COMPARISON:  Radiographs 03/04/2023.   FINDINGS: Segmentation: Conventional anatomy assumed, with the last open disc space designated L5-S1.Concordant with prior radiographs.   Alignment: Convex left scoliosis centered at L2-3, measuring 27 degrees. There is leftward listhesis of L4 relative to L5. The lateral alignment is normal.   Vertebrae: No worrisome osseous lesion, acute fracture or pars defect. Chronic endplate degenerative changes at L4-5. The visualized sacroiliac joints appear unremarkable.   Conus medullaris: Extends to the L1 level and appears normal.   Paraspinal and other soft tissues: No significant paraspinal findings. 2.8 cm cyst in the lower pole of the left kidney for which no follow-up imaging is recommended. 2.1 cm right adrenal nodule (image 2/17) is incompletely characterized, although likely an incidental adrenal adenoma.   Disc levels:   Sagittal images through the lower thoracic spine demonstrate mild disc bulging and asymmetric facet hypertrophy on the left at T10-11 and T11-12. No cord deformity. There is mild left-sided foraminal narrowing.   T12-L1: No significant findings.   L1-2: Disc height and hydration are maintained. Mild bilateral facet hypertrophy. No spinal stenosis or nerve root encroachment.   L2-3: Mild loss of disc height with mild disc bulging and endplate osteophytes asymmetric to the right. Mild bilateral facet hypertrophy. Mild to moderate right lateral recess and right foraminal narrowing. The  spinal canal and left foramen are patent.   L3-4: Preserved disc height with mild disc bulging, facet and ligamentous hypertrophy. Mild right lateral recess narrowing. The spinal canal and both foramina are widely patent.   L4-5: Chronic loss of disc height with annular disc bulging and endplate osteophytes. As above, there is leftward listhesis of L4 relative to L5. Moderate facet and ligamentous hypertrophy. These factors contribute to mild to moderate multifactorial spinal stenosis with moderate narrowing of both lateral recesses, severe right and moderate left foraminal narrowing.   L5-S1: Relatively preserved disc height with annular disc bulging and endplate osteophytes asymmetric to the left. There is asymmetric left-sided facet hypertrophy. These factors contribute to severe left foraminal narrowing and probable left L5 nerve root encroachment. There is mild asymmetric narrowing of the left lateral recess. The spinal canal, right lateral recess and right foramen are patent.   IMPRESSION: 1. Convex left scoliosis with leftward listhesis of L4 relative to L5. No acute osseous findings. 2. Mild to moderate right lateral recess and right foraminal narrowing at L2-3. 3. Mild to moderate multifactorial spinal stenosis at L4-5 with moderate narrowing of both lateral recesses, severe right and moderate left foraminal narrowing. 4. Severe left foraminal narrowing at L5-S1 with probable left L5 nerve root encroachment. 5. Probable incidental right adrenal adenoma. Recommend more definitive characterization with adrenal washout CT or chemical shift MRI. JACR 2017 Aug; 14(8):1038-44, JCAT 2016 Mar-Apr; 40(2):194-200, Urol J 2006 Spring; 3(2):71-4.     Electronically  Signed   By: Carey Bullocks M.D.   On: 04/02/2023 16:17     Objective:  VS:  HT:    WT:   BMI:     BP:(!) 157/88  HR:64bpm  TEMP: ( )  RESP:  Physical Exam Vitals and nursing note reviewed.   Constitutional:      General: He is not in acute distress.    Appearance: Normal appearance. He is not ill-appearing.  HENT:     Head: Normocephalic and atraumatic.     Right Ear: External ear normal.     Left Ear: External ear normal.     Nose: No congestion.  Eyes:     Extraocular Movements: Extraocular movements intact.  Cardiovascular:     Rate and Rhythm: Normal rate.     Pulses: Normal pulses.  Pulmonary:     Effort: Pulmonary effort is normal. No respiratory distress.  Abdominal:     General: There is no distension.     Palpations: Abdomen is soft.  Musculoskeletal:        General: No tenderness or signs of injury.     Cervical back: Neck supple.     Right lower leg: No edema.     Left lower leg: No edema.     Comments: Patient has good distal strength without clonus.  Skin:    Findings: No erythema or rash.  Neurological:     General: No focal deficit present.     Mental Status: He is alert and oriented to person, place, and time.     Sensory: No sensory deficit.     Motor: No weakness or abnormal muscle tone.     Coordination: Coordination normal.  Psychiatric:        Mood and Affect: Mood normal.        Behavior: Behavior normal.      Imaging: No results found.

## 2024-03-02 NOTE — Procedures (Signed)
 Lumbosacral Transforaminal Epidural Steroid Injection - Sub-Pedicular Approach with Fluoroscopic Guidance  Patient: Kenneth Logan      Date of Birth: 04/17/1966 MRN: [id number] PCP: Irven Coe, MD      Visit Date: 03/02/2024   Universal Protocol:    Date/Time: 03/02/2024  Consent Given By: the patient  Position: PRONE  Additional Comments: Vital signs were monitored before and after the procedure. Patient was prepped and draped in the usual sterile fashion. The correct patient, procedure, and site was verified.   Injection Procedure Details:   Procedure diagnoses: Lumbar radiculopathy [M54.16]    Meds Administered:  Meds ordered this encounter  Medications   methylPREDNISolone acetate (DEPO-MEDROL) injection 40 mg    Laterality: Left  Location/Site: L5  Needle:5.0 in., 22 ga.  Short bevel or Quincke spinal needle  Needle Placement: Transforaminal  Findings:    -Comments: Excellent flow of contrast along the nerve, nerve root and into the epidural space.  Procedure Details: After squaring off the end-plates to get a true AP view, the C-arm was positioned so that an oblique view of the foramen as noted above was visualized. The target area is just inferior to the "nose of the scotty dog" or sub pedicular. The soft tissues overlying this structure were infiltrated with 2-3 ml. of 1% Lidocaine without Epinephrine.  The spinal needle was inserted toward the target using a "trajectory" view along the fluoroscope beam.  Under AP and lateral visualization, the needle was advanced so it did not puncture dura and was located close the 6 O'Clock position of the pedical in AP tracterory. Biplanar projections were used to confirm position. Aspiration was confirmed to be negative for CSF and/or blood. A 1-2 ml. volume of Isovue-250 was injected and flow of contrast was noted at each level. Radiographs were obtained for documentation purposes.   After attaining the desired flow of  contrast documented above, a 0.5 to 1.0 ml test dose of 0.25% Marcaine was injected into each respective transforaminal space.  The patient was observed for 90 seconds post injection.  After no sensory deficits were reported, and normal lower extremity motor function was noted,   the above injectate was administered so that equal amounts of the injectate were placed at each foramen (level) into the transforaminal epidural space.   Additional Comments:  No complications occurred Dressing: 2 x 2 sterile gauze and Band-Aid    Post-procedure details: Patient was observed during the procedure. Post-procedure instructions were reviewed.  Patient left the clinic in stable condition.

## 2024-03-11 DIAGNOSIS — L918 Other hypertrophic disorders of the skin: Secondary | ICD-10-CM | POA: Diagnosis not present

## 2024-03-16 DIAGNOSIS — M5136 Other intervertebral disc degeneration, lumbar region with discogenic back pain only: Secondary | ICD-10-CM | POA: Diagnosis not present

## 2024-03-16 DIAGNOSIS — M9903 Segmental and somatic dysfunction of lumbar region: Secondary | ICD-10-CM | POA: Diagnosis not present

## 2024-03-16 DIAGNOSIS — M47816 Spondylosis without myelopathy or radiculopathy, lumbar region: Secondary | ICD-10-CM | POA: Diagnosis not present

## 2024-03-16 DIAGNOSIS — M9905 Segmental and somatic dysfunction of pelvic region: Secondary | ICD-10-CM | POA: Diagnosis not present

## 2024-03-21 DIAGNOSIS — G894 Chronic pain syndrome: Secondary | ICD-10-CM | POA: Diagnosis not present

## 2024-05-02 DIAGNOSIS — I10 Essential (primary) hypertension: Secondary | ICD-10-CM | POA: Diagnosis not present

## 2024-05-02 DIAGNOSIS — M109 Gout, unspecified: Secondary | ICD-10-CM | POA: Diagnosis not present

## 2024-05-02 DIAGNOSIS — Z125 Encounter for screening for malignant neoplasm of prostate: Secondary | ICD-10-CM | POA: Diagnosis not present

## 2024-05-02 DIAGNOSIS — E782 Mixed hyperlipidemia: Secondary | ICD-10-CM | POA: Diagnosis not present

## 2024-05-04 DIAGNOSIS — I10 Essential (primary) hypertension: Secondary | ICD-10-CM | POA: Diagnosis not present

## 2024-05-04 DIAGNOSIS — E782 Mixed hyperlipidemia: Secondary | ICD-10-CM | POA: Diagnosis not present

## 2024-05-04 DIAGNOSIS — Z125 Encounter for screening for malignant neoplasm of prostate: Secondary | ICD-10-CM | POA: Diagnosis not present

## 2024-05-04 DIAGNOSIS — M109 Gout, unspecified: Secondary | ICD-10-CM | POA: Diagnosis not present

## 2024-05-04 DIAGNOSIS — Z0189 Encounter for other specified special examinations: Secondary | ICD-10-CM | POA: Diagnosis not present

## 2024-06-17 DIAGNOSIS — Z1211 Encounter for screening for malignant neoplasm of colon: Secondary | ICD-10-CM | POA: Diagnosis not present

## 2024-06-17 DIAGNOSIS — D120 Benign neoplasm of cecum: Secondary | ICD-10-CM | POA: Diagnosis not present

## 2024-06-18 DIAGNOSIS — M5416 Radiculopathy, lumbar region: Secondary | ICD-10-CM | POA: Diagnosis not present

## 2024-06-22 DIAGNOSIS — M545 Low back pain, unspecified: Secondary | ICD-10-CM | POA: Diagnosis not present

## 2024-06-28 DIAGNOSIS — M47816 Spondylosis without myelopathy or radiculopathy, lumbar region: Secondary | ICD-10-CM | POA: Diagnosis not present

## 2024-06-30 DIAGNOSIS — Z1331 Encounter for screening for depression: Secondary | ICD-10-CM | POA: Diagnosis not present

## 2024-06-30 DIAGNOSIS — M5416 Radiculopathy, lumbar region: Secondary | ICD-10-CM | POA: Diagnosis not present

## 2024-06-30 DIAGNOSIS — M419 Scoliosis, unspecified: Secondary | ICD-10-CM | POA: Diagnosis not present

## 2024-06-30 DIAGNOSIS — M47816 Spondylosis without myelopathy or radiculopathy, lumbar region: Secondary | ICD-10-CM | POA: Diagnosis not present

## 2024-07-20 DIAGNOSIS — M47816 Spondylosis without myelopathy or radiculopathy, lumbar region: Secondary | ICD-10-CM | POA: Diagnosis not present

## 2024-08-15 DIAGNOSIS — M47816 Spondylosis without myelopathy or radiculopathy, lumbar region: Secondary | ICD-10-CM | POA: Diagnosis not present

## 2024-09-05 DIAGNOSIS — M47816 Spondylosis without myelopathy or radiculopathy, lumbar region: Secondary | ICD-10-CM | POA: Diagnosis not present

## 2024-10-03 DIAGNOSIS — M47816 Spondylosis without myelopathy or radiculopathy, lumbar region: Secondary | ICD-10-CM | POA: Diagnosis not present

## 2024-10-03 DIAGNOSIS — M4156 Other secondary scoliosis, lumbar region: Secondary | ICD-10-CM | POA: Diagnosis not present

## 2024-10-03 DIAGNOSIS — M5416 Radiculopathy, lumbar region: Secondary | ICD-10-CM | POA: Diagnosis not present

## 2024-10-19 DIAGNOSIS — M5416 Radiculopathy, lumbar region: Secondary | ICD-10-CM | POA: Diagnosis not present

## 2024-10-24 ENCOUNTER — Encounter: Payer: Self-pay | Admitting: Radiology

## 2024-11-15 DIAGNOSIS — M5416 Radiculopathy, lumbar region: Secondary | ICD-10-CM | POA: Diagnosis not present

## 2024-11-15 DIAGNOSIS — M479 Spondylosis, unspecified: Secondary | ICD-10-CM | POA: Diagnosis not present
